# Patient Record
Sex: Male | Born: 1995 | Race: White | Hispanic: No | Marital: Single | Smoking: Current every day smoker
Health system: Southern US, Community
[De-identification: ages and names within clinical notes are randomized; demographics above are authoritative.]

## PROBLEM LIST (undated history)

## (undated) VITALS — BP 107/74 | HR 87 | Temp 97.8°F | Resp 20 | Ht 69.0 in | Wt 149.5 lb

## (undated) DIAGNOSIS — F419 Anxiety disorder, unspecified: Secondary | ICD-10-CM

## (undated) DIAGNOSIS — F319 Bipolar disorder, unspecified: Secondary | ICD-10-CM

## (undated) DIAGNOSIS — K589 Irritable bowel syndrome without diarrhea: Secondary | ICD-10-CM

## (undated) DIAGNOSIS — F909 Attention-deficit hyperactivity disorder, unspecified type: Secondary | ICD-10-CM

## (undated) DIAGNOSIS — K9049 Malabsorption due to intolerance, not elsewhere classified: Secondary | ICD-10-CM

## (undated) DIAGNOSIS — E739 Lactose intolerance, unspecified: Secondary | ICD-10-CM

## (undated) DIAGNOSIS — S34109A Unspecified injury to unspecified level of lumbar spinal cord, initial encounter: Secondary | ICD-10-CM

## (undated) DIAGNOSIS — R51 Headache: Secondary | ICD-10-CM

## (undated) HISTORY — PX: BACK SURGERY: SHX140

## (undated) HISTORY — DX: Irritable bowel syndrome, unspecified: K58.9

---
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
## 2013-07-01 ENCOUNTER — Encounter (HOSPITAL_COMMUNITY): Payer: Self-pay | Admitting: Emergency Medicine

## 2013-07-01 DIAGNOSIS — Z79899 Other long term (current) drug therapy: Secondary | ICD-10-CM | POA: Insufficient documentation

## 2013-07-01 DIAGNOSIS — R112 Nausea with vomiting, unspecified: Secondary | ICD-10-CM | POA: Insufficient documentation

## 2013-07-01 DIAGNOSIS — E7800 Pure hypercholesterolemia, unspecified: Secondary | ICD-10-CM | POA: Insufficient documentation

## 2013-07-01 DIAGNOSIS — Z8659 Personal history of other mental and behavioral disorders: Secondary | ICD-10-CM | POA: Insufficient documentation

## 2013-07-01 DIAGNOSIS — R1032 Left lower quadrant pain: Secondary | ICD-10-CM | POA: Insufficient documentation

## 2013-07-01 DIAGNOSIS — K589 Irritable bowel syndrome without diarrhea: Secondary | ICD-10-CM | POA: Insufficient documentation

## 2013-07-01 DIAGNOSIS — F17200 Nicotine dependence, unspecified, uncomplicated: Secondary | ICD-10-CM | POA: Insufficient documentation

## 2013-07-01 DIAGNOSIS — Z791 Long term (current) use of non-steroidal anti-inflammatories (NSAID): Secondary | ICD-10-CM | POA: Insufficient documentation

## 2013-07-01 LAB — COMPREHENSIVE METABOLIC PANEL
ALT: 26 U/L (ref 0–53)
Albumin: 4.2 g/dL (ref 3.5–5.2)
Alkaline Phosphatase: 81 U/L (ref 39–117)
Potassium: 4.1 mEq/L (ref 3.5–5.1)
Sodium: 136 mEq/L (ref 135–145)
Total Protein: 7.6 g/dL (ref 6.0–8.3)

## 2013-07-01 LAB — CBC WITH DIFFERENTIAL/PLATELET
Basophils Absolute: 0 10*3/uL (ref 0.0–0.1)
Eosinophils Absolute: 1 10*3/uL — ABNORMAL HIGH (ref 0.0–0.7)
Lymphocytes Relative: 24 % (ref 12–46)
MCHC: 35.6 g/dL (ref 30.0–36.0)
Neutrophils Relative %: 58 % (ref 43–77)
RDW: 14.5 % (ref 11.5–15.5)

## 2013-07-01 NOTE — ED Notes (Signed)
Patient presents with multiple complaints. Abdominal pain LLQ, diarrhea, HA, bloodshot eyes.

## 2013-07-01 NOTE — ED Notes (Signed)
No neuro deficits noted.

## 2013-07-02 ENCOUNTER — Emergency Department (HOSPITAL_COMMUNITY)
Admission: EM | Admit: 2013-07-02 | Discharge: 2013-07-02 | Disposition: A | Discharge status: Home / Self Care | Payer: Medicaid Other | Attending: Emergency Medicine | Admitting: Emergency Medicine

## 2013-07-02 ENCOUNTER — Encounter (HOSPITAL_COMMUNITY): Payer: Self-pay | Admitting: *Deleted

## 2013-07-02 ENCOUNTER — Emergency Department (HOSPITAL_COMMUNITY): Payer: Medicaid Other

## 2013-07-02 DIAGNOSIS — K589 Irritable bowel syndrome without diarrhea: Secondary | ICD-10-CM

## 2013-07-02 DIAGNOSIS — R109 Unspecified abdominal pain: Secondary | ICD-10-CM

## 2013-07-02 DIAGNOSIS — R112 Nausea with vomiting, unspecified: Secondary | ICD-10-CM

## 2013-07-02 LAB — URINALYSIS, ROUTINE W REFLEX MICROSCOPIC
Glucose, UA: NEGATIVE mg/dL
Ketones, ur: NEGATIVE mg/dL
Leukocytes, UA: NEGATIVE
pH: 5.5 (ref 5.0–8.0)

## 2013-07-02 MED ORDER — MORPHINE SULFATE 4 MG/ML IJ SOLN
4.0000 mg | Freq: Once | INTRAMUSCULAR | Status: AC
  Administered 2013-07-02: 4 mg via INTRAVENOUS
  Filled 2013-07-02: qty 1

## 2013-07-02 MED ORDER — LOPERAMIDE HCL 2 MG PO CAPS: 2.0000 mg | ORAL_CAPSULE | Freq: Four times a day (QID) | ORAL | Status: DC | PRN

## 2013-07-02 MED ORDER — IOHEXOL 300 MG/ML  SOLN
25.0000 mL | Freq: Once | INTRAMUSCULAR | Status: AC | PRN
  Administered 2013-07-02: 25 mL via ORAL

## 2013-07-02 MED ORDER — ONDANSETRON HCL 4 MG/2ML IJ SOLN
4.0000 mg | Freq: Once | INTRAMUSCULAR | Status: AC
  Administered 2013-07-02: 4 mg via INTRAVENOUS

## 2013-07-02 MED ORDER — IOHEXOL 300 MG/ML  SOLN
100.0000 mL | Freq: Once | INTRAMUSCULAR | Status: AC | PRN
  Administered 2013-07-02: 100 mL via INTRAVENOUS

## 2013-07-02 MED ORDER — ONDANSETRON 8 MG PO TBDP: 8.0000 mg | ORAL_TABLET | Freq: Three times a day (TID) | ORAL | Status: DC | PRN

## 2013-07-02 MED ORDER — ONDANSETRON HCL 4 MG/2ML IJ SOLN
4.0000 mg | Freq: Once | INTRAMUSCULAR | Status: DC
  Filled 2013-07-02: qty 2

## 2013-07-02 NOTE — ED Notes (Signed)
Pt discharged.Vital signs stable and GCS 15.Discharge instruction given. 

## 2013-07-02 NOTE — ED Provider Notes (Signed)
CSN: [id number]     Arrival date & time 07/01/13  2121 History   First MD Initiated Contact with Patient 07/02/13 0221     Chief Complaint  Patient presents with  . Abdominal Pain   (Consider location/radiation/quality/duration/timing/severity/associated sxs/prior Treatment) HPI 17 year old male presents to emergency room with complaint of diffuse abdominal pains, worse in his left lower quadrant, acute on chronic diarrhea, nausea and vomiting, headache, and reddened eyes.  Patient has history of IBS and has chronic diarrhea.  He has not taken any of his Imodium.  He denies any fever or chills.  He does not normally have nausea and vomiting with his IBS.  Sick contacts, no recent illnesses, no recent antibiotics. Past Medical History  Diagnosis Date  . IBS (irritable bowel syndrome)   . Bipolar 1 disorder   . CHO intolerance     high cholesterol, bipolar, anxiety disorder, ADHD  . Anxiety   . ADHD (attention deficit hyperactivity disorder)   . Irritable bowel syndrome   . Headache(784.0)    No past surgical history on file. No family history on file. History  Substance Use Topics  . Smoking status: Current Every Day Smoker -- 1.50 packs/day for 2 years    Types: Cigarettes  . Smokeless tobacco: Current User    Types: Snuff  . Alcohol Use: No    Review of Systems  All other systems reviewed and are negative.    Allergies  Calamari oil and Other  Home Medications   Current Outpatient Rx  Name  Route  Sig  Dispense  Refill  . dicyclomine (BENTYL) 10 MG capsule   Oral   Take 10 mg by mouth 4 (four) times daily -  before meals and at bedtime.         Marland Kitchen ibuprofen (ADVIL,MOTRIN) 600 MG tablet   Oral   Take 1 tablet (600 mg total) by mouth every 6 (six) hours as needed for pain.   30 tablet   0   . meloxicam (MOBIC) 15 MG tablet   Oral   Take 15 mg by mouth 2 (two) times daily.         . Multiple Vitamin (MULTIVITAMIN WITH MINERALS) TABS   Oral   Take 1 tablet  by mouth daily.         Marland Kitchen loperamide (IMODIUM) 2 MG capsule   Oral   Take 1 capsule (2 mg total) by mouth 4 (four) times daily as needed for diarrhea or loose stools.   12 capsule   0   . ondansetron (ZOFRAN ODT) 8 MG disintegrating tablet   Oral   Take 1 tablet (8 mg total) by mouth every 8 (eight) hours as needed for nausea or vomiting.   20 tablet   0    BP 100/58  Pulse 67  Temp(Src) 99.5 F (37.5 C) (Oral)  Resp 16  SpO2 98% Physical Exam  Nursing note and vitals reviewed. Constitutional: He is oriented to person, place, and time. He appears well-developed and well-nourished.  HENT:  Head: Normocephalic and atraumatic.  Nose: Nose normal.  Mouth/Throat: Oropharynx is clear and moist.  Eyes: Conjunctivae and EOM are normal. Pupils are equal, round, and reactive to light.  Neck: Normal range of motion. Neck supple. No JVD present. No tracheal deviation present. No thyromegaly present.  Cardiovascular: Normal rate, regular rhythm, normal heart sounds and intact distal pulses.  Exam reveals no gallop and no friction rub.   No murmur heard. Pulmonary/Chest: Effort normal and breath  sounds normal. No stridor. No respiratory distress. He has no wheezes. He has no rales. He exhibits no tenderness.  Abdominal: Soft. He exhibits no distension and no mass. There is tenderness (diffuse abdominal pain, worse in left lower cord.  Hyperactive bowel sounds). There is no rebound and no guarding.  Musculoskeletal: Normal range of motion. He exhibits no edema and no tenderness.  Lymphadenopathy:    He has no cervical adenopathy.  Neurological: He is alert and oriented to person, place, and time. He exhibits normal muscle tone. Coordination normal.  Skin: Skin is warm and dry. No rash noted. No erythema. No pallor.  Psychiatric: He has a normal mood and affect. His behavior is normal. Judgment and thought content normal.    ED Course  Procedures (including critical care time) Labs  Review Labs Reviewed  CBC WITH DIFFERENTIAL - Abnormal; Notable for the following:    WBC 14.9 (*)    Eosinophils Relative 7 (*)    Neutro Abs 8.7 (*)    Monocytes Absolute 1.6 (*)    Eosinophils Absolute 1.0 (*)    All other components within normal limits  COMPREHENSIVE METABOLIC PANEL  URINALYSIS, ROUTINE W REFLEX MICROSCOPIC   Imaging Review Ct Abdomen Pelvis W Contrast  07/02/2013   CLINICAL DATA:  Left lower quadrant abdominal pain.  History of IBS.  EXAM: CT ABDOMEN AND PELVIS WITH CONTRAST  TECHNIQUE: Multidetector CT imaging of the abdomen and pelvis was performed using the standard protocol following bolus administration of intravenous contrast.  CONTRAST:  OMNIPAQUE IOHEXOL 300 MG/ML  SOLN  COMPARISON:  06/10/2009  FINDINGS: BODY WALL: Unremarkable.  LOWER CHEST: Unremarkable.  ABDOMEN/PELVIS:  Liver: No focal abnormality.  Biliary: No evidence of biliary obstruction or stone.  Pancreas: Unremarkable.  Spleen: Unremarkable.  Adrenals: Unremarkable.  Kidneys and ureters: No hydronephrosis or stone.  Bladder: Unremarkable.  Reproductive: Unremarkable.  Bowel: No obstruction. No appendiceal inflammation.  Retroperitoneum: Enlarged small bowel mesenteric lymph nodes, decreased in size since 2010 however. Current nodal enlargement may represent scarring from previous infection.  Peritoneum: No free fluid or gas.  Vascular: No acute abnormality.  OSSEOUS: L5 pars defects bilaterally. No anterolisthesis or significant degenerative change at this level. 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  IMPRESSION: No evidence of acute intra-abdominal disease.   Electronically Signed   By: Tiburcio Pea M.D.   On: 07/02/2013 05:24    EKG Interpretation   None       MDM   1. Abdominal pain   2. Nausea and vomiting   3. IBS (irritable bowel syndrome)    17 year old male with one week of worsening of his chronic diarrhea, left low quadrant pain starting on Saturday,  with vomiting of dark emesis.  Patient has history of irritable  Bowel syndrome.  He's been taking his Bentyl as prescribed.  He has not taken any of his Imodium.  No fevers no chills.  No sick contacts.  No recent antibiotics use.  Workup here without significant findings aside from slight leukocytosis.  CT scan of abdomen, negative.  Patient feeling better after medication.  Will discharge home to followup with PCP and GI    Olivia Mackie, MD 07/03/13 0730

## 2013-08-13 ENCOUNTER — Emergency Department (HOSPITAL_COMMUNITY)
Admission: EM | Admit: 2013-08-13 | Discharge: 2013-08-14 | Disposition: A | Discharge status: Other Acute Inpatient Hospital | Payer: Medicaid Other | Source: Home / Self Care | Attending: Emergency Medicine | Admitting: Emergency Medicine

## 2013-08-13 ENCOUNTER — Encounter (HOSPITAL_COMMUNITY): Payer: Self-pay | Admitting: Emergency Medicine

## 2013-08-13 ENCOUNTER — Ambulatory Visit (HOSPITAL_COMMUNITY)
Admission: RE | Admit: 2013-08-13 | Discharge: 2013-08-13 | Disposition: A | Discharge status: Home / Self Care | Payer: Federal, State, Local not specified - Other | Attending: Psychiatry | Admitting: Psychiatry

## 2013-08-13 DIAGNOSIS — F319 Bipolar disorder, unspecified: Secondary | ICD-10-CM | POA: Diagnosis present

## 2013-08-13 LAB — URINALYSIS, ROUTINE W REFLEX MICROSCOPIC
Bilirubin Urine: NEGATIVE
GLUCOSE, UA: NEGATIVE mg/dL
HGB URINE DIPSTICK: NEGATIVE
Ketones, ur: NEGATIVE mg/dL
LEUKOCYTES UA: NEGATIVE
Nitrite: NEGATIVE
PH: 6 (ref 5.0–8.0)
PROTEIN: NEGATIVE mg/dL
SPECIFIC GRAVITY, URINE: 1.005 (ref 1.005–1.030)
Urobilinogen, UA: 0.2 mg/dL (ref 0.0–1.0)

## 2013-08-13 LAB — CBC WITH DIFFERENTIAL/PLATELET
BASOS ABS: 0 10*3/uL (ref 0.0–0.1)
Basophils Relative: 0 % (ref 0–1)
EOS ABS: 0.3 10*3/uL (ref 0.0–0.7)
EOS PCT: 2 % (ref 0–5)
HCT: 46.2 % (ref 39.0–52.0)
Hemoglobin: 16 g/dL (ref 13.0–17.0)
Lymphocytes Relative: 23 % (ref 12–46)
Lymphs Abs: 4 10*3/uL (ref 0.7–4.0)
MCH: 29.1 pg (ref 26.0–34.0)
MCHC: 34.6 g/dL (ref 30.0–36.0)
MCV: 84.2 fL (ref 78.0–100.0)
Monocytes Absolute: 1.5 10*3/uL — ABNORMAL HIGH (ref 0.1–1.0)
Monocytes Relative: 9 % (ref 3–12)
NEUTROS PCT: 66 % (ref 43–77)
Neutro Abs: 11.3 10*3/uL — ABNORMAL HIGH (ref 1.7–7.7)
PLATELETS: 377 10*3/uL (ref 150–400)
RBC: 5.49 MIL/uL (ref 4.22–5.81)
RDW: 13.8 % (ref 11.5–15.5)
WBC: 17.2 10*3/uL — AB (ref 4.0–10.5)

## 2013-08-13 LAB — RAPID URINE DRUG SCREEN, HOSP PERFORMED
AMPHETAMINES: NOT DETECTED
BENZODIAZEPINES: NOT DETECTED
Barbiturates: NOT DETECTED
COCAINE: NOT DETECTED
OPIATES: NOT DETECTED
Tetrahydrocannabinol: NOT DETECTED

## 2013-08-13 LAB — COMPREHENSIVE METABOLIC PANEL
ALBUMIN: 4.4 g/dL (ref 3.5–5.2)
ALK PHOS: 86 U/L (ref 39–117)
ALT: 34 U/L (ref 0–53)
AST: 31 U/L (ref 0–37)
BUN: 14 mg/dL (ref 6–23)
CALCIUM: 10 mg/dL (ref 8.4–10.5)
CO2: 30 mEq/L (ref 19–32)
Chloride: 98 mEq/L (ref 96–112)
Creatinine, Ser: 0.96 mg/dL (ref 0.50–1.35)
GFR calc Af Amer: >90 mL/min (ref >90)
GFR calc non Af Amer: >90 mL/min (ref >90)
Glucose, Bld: 96 mg/dL (ref 70–99)
POTASSIUM: 4.6 meq/L (ref 3.7–5.3)
SODIUM: 139 meq/L (ref 137–147)
TOTAL PROTEIN: 8.2 g/dL (ref 6.0–8.3)
Total Bilirubin: 0.3 mg/dL (ref 0.3–1.2)

## 2013-08-13 LAB — ACETAMINOPHEN LEVEL: Acetaminophen (Tylenol), Serum: <15 ug/mL (ref 10–30)

## 2013-08-13 LAB — ETHANOL: Alcohol, Ethyl (B): <11 mg/dL (ref 0–11)

## 2013-08-13 MED ORDER — BACID PO TABS
2.0000 | ORAL_TABLET | Freq: Three times a day (TID) | ORAL | Status: DC
  Administered 2013-08-14 (×3): 2 via ORAL
  Filled 2013-08-13 (×4): qty 2

## 2013-08-13 MED ORDER — LORAZEPAM 1 MG PO TABS
1.0000 mg | ORAL_TABLET | Freq: Three times a day (TID) | ORAL | Status: DC | PRN
Start: 2013-08-13 — End: 2013-08-14

## 2013-08-13 MED ORDER — ADULT MULTIVITAMIN W/MINERALS CH
1.0000 | ORAL_TABLET | Freq: Every day | ORAL | Status: DC
  Administered 2013-08-14: 1 via ORAL
  Filled 2013-08-13: qty 1

## 2013-08-13 MED ORDER — DICYCLOMINE HCL 10 MG PO CAPS: 10.0000 mg | ORAL_CAPSULE | Freq: Three times a day (TID) | ORAL | Status: DC

## 2013-08-13 MED ORDER — IBUPROFEN 200 MG PO TABS
600.0000 mg | ORAL_TABLET | Freq: Four times a day (QID) | ORAL | Status: DC | PRN
  Administered 2013-08-14: 600 mg via ORAL
  Filled 2013-08-13: qty 3

## 2013-08-13 MED ORDER — MELOXICAM 15 MG PO TABS: 15.0000 mg | ORAL_TABLET | Freq: Two times a day (BID) | ORAL | Status: DC

## 2013-08-13 MED ORDER — ONDANSETRON HCL 4 MG PO TABS: 4.0000 mg | ORAL_TABLET | Freq: Three times a day (TID) | ORAL | Status: DC | PRN

## 2013-08-13 MED ORDER — LOPERAMIDE HCL 2 MG PO CAPS: 2.0000 mg | ORAL_CAPSULE | Freq: Four times a day (QID) | ORAL | Status: DC | PRN

## 2013-08-13 MED ORDER — ONDANSETRON 4 MG PO TBDP: 8.0000 mg | ORAL_TABLET | Freq: Three times a day (TID) | ORAL | Status: DC | PRN

## 2013-08-13 MED ORDER — DICLOFENAC SODIUM 75 MG PO TBEC
75.0000 mg | DELAYED_RELEASE_TABLET | Freq: Two times a day (BID) | ORAL | Status: DC
  Administered 2013-08-14 (×2): 75 mg via ORAL
  Filled 2013-08-13 (×3): qty 1

## 2013-08-13 MED ORDER — NICOTINE 21 MG/24HR TD PT24
21.0000 mg | MEDICATED_PATCH | Freq: Every day | TRANSDERMAL | Status: DC
  Administered 2013-08-14: 21 mg via TRANSDERMAL
  Filled 2013-08-13: qty 1

## 2013-08-13 MED ORDER — HYOSCYAMINE SULFATE 0.125 MG PO TABS
0.3750 mg | ORAL_TABLET | Freq: Two times a day (BID) | ORAL | Status: DC
  Administered 2013-08-14 (×3): 0.375 mg via ORAL
  Filled 2013-08-13 (×3): qty 3

## 2013-08-13 MED ORDER — ALUM & MAG HYDROXIDE-SIMETH 200-200-20 MG/5ML PO SUSP: 30.0000 mL | ORAL | Status: DC | PRN
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
## 2013-08-13 NOTE — ED Notes (Signed)
Patient is alert and oriented x3.  He is wanting to be seen for SI.  Patient states that he  Has been having thoughts over the last month and has a plan to cut major arteries with a  Knife.  Patient states that he only has chronic pain in his lower back.

## 2013-08-14 ENCOUNTER — Encounter (HOSPITAL_COMMUNITY): Payer: Self-pay | Admitting: *Deleted

## 2013-08-14 ENCOUNTER — Inpatient Hospital Stay (HOSPITAL_COMMUNITY): Admit: 2013-08-14 | Payer: Self-pay

## 2013-08-14 ENCOUNTER — Inpatient Hospital Stay (HOSPITAL_COMMUNITY)
Admission: AD | Admit: 2013-08-14 | Discharge: 2013-08-17 | DRG: 885 | Disposition: A | Discharge status: Home / Self Care | Payer: Medicaid Other | Source: Intra-hospital | Attending: Psychiatry | Admitting: Psychiatry

## 2013-08-14 ENCOUNTER — Encounter (HOSPITAL_COMMUNITY): Payer: Self-pay | Admitting: Psychiatry

## 2013-08-14 DIAGNOSIS — K589 Irritable bowel syndrome without diarrhea: Secondary | ICD-10-CM | POA: Diagnosis present

## 2013-08-14 DIAGNOSIS — F331 Major depressive disorder, recurrent, moderate: Secondary | ICD-10-CM

## 2013-08-14 DIAGNOSIS — F319 Bipolar disorder, unspecified: Secondary | ICD-10-CM | POA: Diagnosis present

## 2013-08-14 DIAGNOSIS — F411 Generalized anxiety disorder: Secondary | ICD-10-CM | POA: Diagnosis present

## 2013-08-14 DIAGNOSIS — Z79899 Other long term (current) drug therapy: Secondary | ICD-10-CM

## 2013-08-14 DIAGNOSIS — F3130 Bipolar disorder, current episode depressed, mild or moderate severity, unspecified: Principal | ICD-10-CM | POA: Diagnosis present

## 2013-08-14 DIAGNOSIS — F902 Attention-deficit hyperactivity disorder, combined type: Secondary | ICD-10-CM

## 2013-08-14 DIAGNOSIS — G4700 Insomnia, unspecified: Secondary | ICD-10-CM | POA: Diagnosis present

## 2013-08-14 DIAGNOSIS — F909 Attention-deficit hyperactivity disorder, unspecified type: Secondary | ICD-10-CM | POA: Diagnosis present

## 2013-08-14 DIAGNOSIS — R45851 Suicidal ideations: Secondary | ICD-10-CM

## 2013-08-14 NOTE — BH Assessment (Signed)
Per Luwanda Daniels, AC at [facility], adult unit is at capacity. Contacted the following facilities for placement:   [facility]: At capacity [facility]: At capacity [facility]: At capacity [facility]: At capacity [facility]: At capacity [facility]: At capacity [facility]: At capacity [facility]: At capacity [facility]: At capacity [facility]: At capacity  [facility]: Left voicemail  [facility]: Beds available. Faxed clinical information for review.   Lajoyce Tamura Ellis Quintessa Simmerman Jr, LPC, [facility] Triage Specialist  

## 2013-08-14 NOTE — Tx Team (Signed)
Initial Interdisciplinary Treatment Plan  PATIENT STRENGTHS: (choose at least two) Ability for insight Active sense of humor Average or above average intelligence Capable of independent living Communication skills  PATIENT STRESSORS: Health problems   PROBLEM LIST: Problem List/Patient Goals Date to be addressed Date deferred Reason deferred Estimated date of resolution  "Want decrease depression and learn ways to control anger in a healthier manner" 14 Aug 2013                                                      DISCHARGE CRITERIA:  Ability to meet basic life and health needs Adequate post-discharge living arrangements Improved stabilization in mood, thinking, and/or behavior Safe-care adequate arrangements made Verbal commitment to aftercare and medication compliance  PRELIMINARY DISCHARGE PLAN: Attend aftercare/continuing care group Outpatient therapy Return to previous living arrangement  PATIENT/FAMIILY INVOLVEMENT: This treatment plan has been presented to and reviewed with the patient, Meredeth IdeMark A Bedrosian, and/or family member.  The patient and family have been given the opportunity to ask questions and make suggestions.  Izola PriceDax, Devone Tousley Mae 08/14/2013, 11:49 PM

## 2013-08-14 NOTE — ED Notes (Signed)
[facility]

## 2013-08-14 NOTE — ED Provider Notes (Signed)
Medical screening examination/treatment/procedure(s) were performed by non-physician practitioner and as supervising physician I was immediately available for consultation/collaboration.  EKG Interpretation   None        Courtney F Horton, MD 08/14/13 1701 

## 2013-08-14 NOTE — [facility] Counselor (Signed)
[facility] Completed support paperwork signed and [facility] and originals placed in pt's chart.   Evette Cristalaroline Paige Leasa Kincannon, [place] Assessment Counselor
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
## 2013-08-14 NOTE — ED Notes (Signed)
MOM IS  TAKING PT BELONGINGS HOME

## 2013-08-15 ENCOUNTER — Encounter (HOSPITAL_COMMUNITY): Payer: Self-pay | Admitting: Family

## 2013-08-15 DIAGNOSIS — F3130 Bipolar disorder, current episode depressed, mild or moderate severity, unspecified: Principal | ICD-10-CM

## 2013-08-15 DIAGNOSIS — R45851 Suicidal ideations: Secondary | ICD-10-CM

## 2013-08-15 MED ORDER — ALUM & MAG HYDROXIDE-SIMETH 200-200-20 MG/5ML PO SUSP: 30.0000 mL | ORAL | Status: DC | PRN

## 2013-08-15 MED ORDER — DIPHENHYDRAMINE HCL 25 MG PO CAPS: 50.0000 mg | ORAL_CAPSULE | Freq: Four times a day (QID) | ORAL | Status: DC | PRN

## 2013-08-15 MED ORDER — LISDEXAMFETAMINE DIMESYLATE 30 MG PO CAPS
30.0000 mg | ORAL_CAPSULE | Freq: Every day | ORAL | Status: DC
  Administered 2013-08-15 – 2013-08-17 (×3): 30 mg via ORAL
  Filled 2013-08-15 (×3): qty 1

## 2013-08-15 MED ORDER — ACETAMINOPHEN 325 MG PO TABS
650.0000 mg | ORAL_TABLET | Freq: Four times a day (QID) | ORAL | Status: DC | PRN
  Administered 2013-08-15 (×2): 650 mg via ORAL
  Filled 2013-08-15 (×2): qty 2

## 2013-08-15 MED ORDER — ONDANSETRON HCL 4 MG PO TABS: 4.0000 mg | ORAL_TABLET | Freq: Three times a day (TID) | ORAL | Status: DC | PRN

## 2013-08-15 MED ORDER — ADULT MULTIVITAMIN W/MINERALS CH
1.0000 | ORAL_TABLET | Freq: Every day | ORAL | Status: DC
  Administered 2013-08-15 – 2013-08-17 (×3): 1 via ORAL
  Filled 2013-08-15 (×6): qty 1

## 2013-08-15 MED ORDER — LORAZEPAM 1 MG PO TABS: 1.0000 mg | ORAL_TABLET | Freq: Three times a day (TID) | ORAL | Status: DC | PRN

## 2013-08-15 MED ORDER — LAMOTRIGINE 25 MG PO TABS
25.0000 mg | ORAL_TABLET | Freq: Two times a day (BID) | ORAL | Status: DC
  Administered 2013-08-15 – 2013-08-16 (×3): 25 mg via ORAL
  Filled 2013-08-15 (×6): qty 1

## 2013-08-15 MED ORDER — NICOTINE 21 MG/24HR TD PT24
21.0000 mg | MEDICATED_PATCH | Freq: Every day | TRANSDERMAL | Status: DC
  Administered 2013-08-15 – 2013-08-17 (×3): 21 mg via TRANSDERMAL
  Filled 2013-08-15 (×6): qty 1

## 2013-08-15 MED ORDER — DICLOFENAC SODIUM 75 MG PO TBEC
75.0000 mg | DELAYED_RELEASE_TABLET | Freq: Two times a day (BID) | ORAL | Status: DC
  Administered 2013-08-15 – 2013-08-17 (×5): 75 mg via ORAL
  Filled 2013-08-15 (×10): qty 1

## 2013-08-15 MED ORDER — HYOSCYAMINE SULFATE ER 0.375 MG PO TB12
0.3750 mg | ORAL_TABLET | Freq: Two times a day (BID) | ORAL | Status: DC
  Administered 2013-08-15 – 2013-08-17 (×5): 0.375 mg via ORAL
  Filled 2013-08-15 (×9): qty 1

## 2013-08-15 MED ORDER — MAGNESIUM HYDROXIDE 400 MG/5ML PO SUSP: 30.0000 mL | Freq: Every day | ORAL | Status: DC | PRN

## 2013-08-15 NOTE — [facility] Suicide Risk Assessment (Signed)
Suicide Risk Assessment  Admission Assessment     Nursing information obtained from:  Patient Demographic factors:  Male;Adolescent or young adult;Caucasian;Low socioeconomic status;Unemployed Current Mental Status:  Suicidal ideation indicated by patient Loss Factors:  NA Historical Factors:  Prior suicide attempts;Family history of mental illness or substance abuse Risk Reduction Factors:  Sense of responsibility to family;Living with another person, especially a relative;Positive coping skills or problem solving skills  CLINICAL FACTORS:   Severe Anxiety and/or Agitation Bipolar Disorder:   Depressive phase Depression:   Anhedonia Hopelessness Impulsivity Insomnia Recent sense of peace/wellbeing Severe Chronic Pain Previous Psychiatric Diagnoses and Treatments Medical Diagnoses and Treatments/Surgeries  COGNITIVE FEATURES THAT CONTRIBUTE TO RISK:  Closed-mindedness Loss of executive function Polarized thinking    SUICIDE RISK:   Moderate:  Frequent suicidal ideation with limited intensity, and duration, some specificity in terms of plans, no associated intent, good self-control, limited dysphoria/symptomatology, some risk factors present, and identifiable protective factors, including available and accessible social support.  PLAN OF CARE: Admit for bipolar depression with suicidal ideation with plan of cutting himself. He needs crisis stabilization, safety monitoring and medication management.   I certify that inpatient services furnished can reasonably be expected to improve the patient's condition.  Aidric Endicott,JANARDHAHA R. 08/15/2013, 10:15 AM

## 2013-08-15 NOTE — Progress Notes (Signed)
Patient ID: [name], male   DOB: Apr 27, 1996, 18 y.o.   MRN: [id number] D- Patient reports his sleep was fair and his appetite is good.  His energy level is high and his ability to pay attention is poor.  He rates his depression at 5/10 he denies thoughts of suicide.  A- Supported patient.  R- patient has been attending groups and interacting with peers and staff.

## 2013-08-15 NOTE — [facility] Group Notes (Signed)
[facility] LCSW Group Therapy  08/15/2013  1:15 PM   Type of Therapy:  Group Therapy  Participation Level:  Active  Participation Quality:  Attentive, Sharing and Supportive  Affect:  Depressed and Flat  Cognitive:  Alert and Oriented  Insight:  Developing/Improving and Engaged  Engagement in Therapy:  Developing/Improving and Engaged  Modes of Intervention:  Clarification, Confrontation, Discussion, Education, Exploration, Limit-setting, Orientation, Problem-solving, Rapport Building, Dance movement psychotherapisteality Testing, Socialization and Support  Summary of Progress/Problems: The topic for group therapy was feelings about diagnosis.  Pt actively participated in group discussion on their past and current diagnosis and how they feel towards this.  Pt also identified how society and family members judge them, based on their diagnosis as well as stereotypes and stigmas.   Pt shared that he has been diagnosed with bipolar disorder and ADHD.  Pt states that he accepted these diagnoses as he has had to deal with them for years.  Pt was able to relate to peers about being judged.  Pt actively participated and was engaged in group discussion.    Reyes IvanChelsea Horton, LCSW 08/15/2013 2:14 PM

## 2013-08-15 NOTE — [facility] Group Notes (Signed)
Adult Psychoeducational Group Note  Date:  08/15/2013 Time:  9:52 PM  Group Topic/Focus:  Wrap-Up Group:   The focus of this group is to help patients review their daily goal of treatment and discuss progress on daily workbooks.  Participation Level:  Active  Participation Quality:  Appropriate and Attentive  Affect:  Appropriate  Cognitive:  Appropriate  Insight: Appropriate  Engagement in Group:  Engaged  Modes of Intervention:  Discussion  Additional Comments:  Patient stated that he as achieved his goal in trying to go back to school. He stated that he spoke with a Child psychotherapistsocial worker who give him the right materials needed to go back to school.  [name], [name] 08/15/2013, 9:52 PM

## 2013-08-15 NOTE — Progress Notes (Signed)
Recreation Therapy Notes  Animal-Assisted Activity/Therapy (AAA/T) Program Checklist/Progress Notes Patient Eligibility Criteria Checklist & Daily Group note for Rec Tx Intervention  Date: 01.06.2015 Time: 2:45pm [address]    AAA/T Program Assumption of Risk Form signed by Patient/ or Parent Legal Guardian yes  Patient is free of allergies or sever asthma yes  Patient reports no fear of animals yes  Patient reports no history of cruelty to animals yes   Patient understands his/her participation is voluntary yes  Patient washes hands before animal contact yes  Patient washes hands after animal contact yes  Behavioral Response: Appropriate, Attentive  Education: Hand Washing, Appropriate Animal Interaction   Education Outcome: Acknowledges understanding   Clinical Observations/Feedback: Patient interacted appropriately with therapy dog team and peers. Patient shared stories about his animals and experiences with animals.    Marykay Lexenise L Zarin Knupp, LRT/CTRS  Jearl KlinefelterBlanchfield, Lena Fieldhouse L 08/15/2013 4:17 PM

## 2013-08-15 NOTE — [facility] Counselor (Signed)
Adult Psychosocial Assessment Update Interdisciplinary Team  Previous Behavior Health Hospital admissions/discharges:  Admissions Discharges  Date: 11/07/12 Date: 11/10/12  Date: Date:  Date: Date:  Date: Date:  Date: Date:   Changes since the last Psychosocial Assessment (including adherence to outpatient mental health and/or substance abuse treatment, situational issues contributing to decompensation and/or relapse). Pt reports coming to the hospital for bipolar disorder, being off his meds and SI.  Pt states that he had a plan to cut his veins.  Pt states that he's been off his meds since April and states [facility] not [facility] due to his mom seeing the same doctor there.  Pt states that he has a family member that goes to [facility] for medication management and would be interested in following up there upon d/c.                 Discharge Plan 1. Will you be returning to the same living situation after discharge?   Yes: X [place] lives [place] in [place] and can return home there.   No:      If no, what is your plan?           2. Would you like a referral for services when you are discharged? Yes: X    If yes, for what services? Pt wants to go to [facility] for medication management.    No:              Summary and Recommendations (to be completed by the evaluator) Patient is a 18 year old Caucasian Male with a diagnosis of Bipolar I disorder, Current or most recent episode depressed, Severe.  Patient lives in [place] his family.  Patient will benefit from crisis stabilization, medication evaluation, group therapy and psycho education in addition to case management for discharge planning.                         Signature:  [name], [name], 08/15/2013 9:26 AM

## 2013-08-15 NOTE — Progress Notes (Signed)
Patient ID: [name], male   DOB: January 24, 1996, 18 y.o.   MRN: [id number] D:  18 year old Caucasian male admitted with suicidal ideation.  Patient states he was having thoughts of wanting to kill himself.  States he located where all the major arteries are in his body and was going to cut himself in an artery with a knife.  States he called his best friend and confessed to him.  Friend was able to contact mother and get family involved in helping the patient, who finally agreed to come for help.  States he has been here in the past and was placed on medication that he thought was working well.  [facility] and they wanted to change all of his medications so he left there and never went back.  He has been off of his medicines for several months.  States he has an anger problem and does not use coping mechanisms effectively.  He usually ends up punching walls or trees.  He also states that sometimes when he is angry he likes to go off by himself and go camping in the woods.  States music is helpful to him as well.   A:  Admission process completed.  Patient offered food and fluids.  He was oriented to his room, the unit, and the unit schedule.  Skin assessment completed, no issues noted.  R:  Cooperative with the admission process.  Patient is very fidgety during the interview.  States he has ADHD and is not on any medication for it at this time.

## 2013-08-15 NOTE — Progress Notes (Signed)
The focus of this group is to educate the patient on the purpose and policies of crisis stabilization and provide a format to answer questions about their admission.  The group details unit policies and expectations of patients while admitted. Patient attended group and was an active participant.  He participated in exercises and in taking about orientation to unit.  Patient feels comfortable because he hs ben here before.  He also talked about recovery and what his goals are.  He wants to go back to school
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
## 2013-08-15 NOTE — Progress Notes (Signed)
Recreation Therapy Notes  INPATIENT RECREATION THERAPY ASSESSMENT  Patient Stressors: School   Coping Skills: Isolate, Avoidance, Exercise, Art/Dance, Music, Sports, Other: Fishing/Camping  Leisure Interests:  Exercise, Gardening, Listening to Music, Playing a Building control surveyorMusical Instrument,  Sports, Travel, [employer] Games, Writing, Other: Fishing/Camping  Personal Challenges: Anger, Communication, Expressing Yourself,   Patient indicated he does not have a physical disability that would prevent participating in recreation therapy group sessions.  Patient listed the following strengths: "Can take apart and fix most things/Fishing"  Patient indicated he would like to change the following about himself: My Anger  Patient listed the following current recreation interests: Exercise/Fish/Camping.         Patient goal for hospitalization: Find a better coping skill than anger.   Patient [place]   Marykay Lexenise L Detrich Rakestraw, LRT/CTRS  Joniqua Sidle L 08/15/2013 4:19 PM

## 2013-08-16 DIAGNOSIS — R45850 Homicidal ideations: Secondary | ICD-10-CM

## 2013-08-16 DIAGNOSIS — F909 Attention-deficit hyperactivity disorder, unspecified type: Secondary | ICD-10-CM

## 2013-08-16 MED ORDER — LAMOTRIGINE 100 MG PO TABS
50.0000 mg | ORAL_TABLET | Freq: Two times a day (BID) | ORAL | Status: DC
  Administered 2013-08-16 – 2013-08-17 (×2): 50 mg via ORAL
  Filled 2013-08-16: qty 14
  Filled 2013-08-16 (×2): qty 2
  Filled 2013-08-16: qty 14
  Filled 2013-08-16 (×2): qty 2

## 2013-08-16 NOTE — Progress Notes (Signed)
[facility] Progress [facility]  08/16/2013 11:49 AM Dennis IdeMark A [name]  MRN:  [id number] Subjective:  Patient was seen and chart reviewed. Patient has been suffering with depression, suicidal ideation and also bipolar disorder. Patient has been diagnosed with attention deficit hyperactivity disorder while in school. Patient has been noncompliant with his medication for a while. Patient has requested medication management for the problems. Patient has been compliant with his medication management and inpatient psychiatric program without difficulties. Patient has no irritability, agitation or aggressive behaviors and currently has passive suicidal ideations and denies homicidal ideation and contracts for safety while in the hospital. Diagnosis:   DSM5: Schizophrenia Disorders:   Obsessive-Compulsive Disorders:   Trauma-Stressor Disorders:   Substance/Addictive Disorders:   Depressive Disorders:  Disruptive Mood Dysregulation Disorder (296.99)  Axis I: ADHD, combined type and Bipolar, Depressed  ADL's:  Impaired  Sleep: Poor  Appetite:  Fair  Suicidal Ideation:  Number contracts for safety while in the hospital  Homicidal Ideation:  Patient was upset with his family and stated he has a homicidal ideation but no intention or plan  AEB (as evidenced by):  Psychiatric Specialty Exam: ROS  Blood pressure 138/78, pulse 84, temperature 97.4 F (36.3 C), temperature source Oral, resp. rate 20, height 5' 10.25" (1.784 m), weight 107.049 kg (236 lb).Body mass index is 33.64 kg/(m^2).  General Appearance: Bizarre, Disheveled and Guarded  Eye Contact::  Fair  Speech:  Clear and Coherent  Volume:  Normal  Mood:  Anxious, Depressed, Hopeless and Worthless  Affect:  Constricted and Depressed  Thought Process:  Goal Directed and Intact  Orientation:  Full (Time, Place, and Person)  Thought Content:  Rumination  Suicidal Thoughts:  Yes.  with intent/plan  Homicidal Thoughts:  Yes.  without intent/plan   Memory:  Immediate;   Fair  Judgement:  Fair  Insight:  Fair  Psychomotor Activity:  Psychomotor Retardation  Concentration:  Fair  Recall:  Fair  Akathisia:  NA  Handed:  Right  AIMS (if indicated):     Assets:  Communication Skills Desire for Improvement Financial Resources/Insurance Housing Physical Health Resilience Social Support  Sleep:  Number of Hours: 6.75   Current Medications: Current Facility-Administered Medications  Medication Dose Route Frequency Provider Last Rate Last Dose  . acetaminophen (TYLENOL) tablet 650 mg  650 mg Oral Q6H PRN Dennis FriesMeghan Blankmann, NP   650 mg at 08/15/13 1547  . alum & mag hydroxide-simeth (MAALOX/MYLANTA) 200-200-20 MG/5ML suspension 30 mL  30 mL Oral Q4H PRN Dennis Blankmann, NP      . diclofenac (VOLTAREN) EC tablet 75 mg  75 mg Oral BID Dennis SettleJanardhaha R Daysy Santini, MD   75 mg at 08/16/13 0759  . diphenhydrAMINE (BENADRYL) capsule 50 mg  50 mg Oral Q6H PRN Dennis Blankmann, NP      . hyoscyamine (LEVBID) 0.375 MG 12 hr tablet 0.375 mg  0.375 mg Oral Q12H Dennis SettleJanardhaha R Alton Tremblay, MD   0.375 mg at 08/16/13 0759  . lamoTRIgine (LAMICTAL) tablet 25 mg  25 mg Oral BID Dennis SettleJanardhaha R Dennis Gadberry, MD   25 mg at 08/16/13 0759  . lisdexamfetamine (VYVANSE) capsule 30 mg  30 mg Oral Daily Dennis SettleJanardhaha R Shakiera Edelson, MD   30 mg at 08/16/13 0759  . LORazepam (ATIVAN) tablet 1 mg  1 mg Oral Q8H PRN Dennis Blankmann, NP      . magnesium hydroxide (MILK OF MAGNESIA) suspension 30 mL  30 mL Oral Daily PRN Dennis FriesMeghan Blankmann, NP      . multivitamin with minerals  tablet 1 tablet  1 tablet Oral Daily Dennis Fries, NP   1 tablet at 08/16/13 0800  . nicotine (NICODERM CQ - dosed in mg/24 hours) patch 21 mg  21 mg Transdermal Daily Dennis Blankmann, NP   21 mg at 08/16/13 0800  . ondansetron (ZOFRAN) tablet 4 mg  4 mg Oral Q8H PRN Dennis Fries, NP        Lab Results: No results found for this or any previous visit (from the past 48 hour(s)).  Physical  Findings: AIMS: Facial and Oral Movements Muscles of Facial Expression: None, normal Lips and Perioral Area: None, normal Jaw: None, normal Tongue: None, normal,Extremity Movements Upper (arms, wrists, hands, fingers): None, normal Lower (legs, knees, ankles, toes): None, normal, Trunk Movements Neck, shoulders, hips: None, normal, Overall Severity Severity of abnormal movements (highest score from questions above): None, normal Incapacitation due to abnormal movements: None, normal Patient's awareness of abnormal movements (rate only patient's report): No Awareness, Dental Status Current problems with teeth and/or dentures?: No Does patient usually wear dentures?: No  CIWA:    COWS:     Treatment Plan Summary: Daily contact with patient to assess and evaluate symptoms and progress in treatment Medication management  Plan: Treatment Plan/Recommendations:   1. Admit for crisis management and stabilization. 2. Medication management to reduce current symptoms to base line and improve the patient's overall level of functioning. Continue Vyvanse 30 mg daily for ADHD and increase lamotrigine 50 mg twice daily for mood swings 3. Treat health problems as indicated. 4. Develop treatment plan to decrease risk of relapse upon discharge and to reduce the need for readmission. 5. Psycho-social education regarding relapse prevention and self care. 6. Health care follow up as needed for medical problems. 7. Restart home medications where appropriate. 8. Disposition plans are in progress, patient may be discharged tomorrow when he can continue to show clinical improvement and contracts for safety   Medical Decision Making Problem Points:  Established problem, worsening (2), New problem, with no additional work-up planned (3), Review of last therapy session (1) and Review of psycho-social stressors (1) Data Points:  Review or order clinical lab tests (1) Review or order medicine tests (1) Review  of medication regiment & side effects (2) Review of new medications or change in dosage (2)  I certify that inpatient services furnished can reasonably be expected to improve the patient's condition.   Josphine Laffey,Dennis R. 08/16/2013, 11:49 AM

## 2013-08-16 NOTE — Tx Team (Signed)
Interdisciplinary Treatment Plan Update (Adult)  Date: 08/16/2013  Time Reviewed:  9:45 AM  Progress in Treatment: Attending groups: Yes Participating in groups:  Yes Taking medication as prescribed:  Yes Tolerating medication:  Yes Family/Significant othe contact made: CSW will make attempts today Patient understands diagnosis:  Yes Discussing patient identified problems/goals with staff:  Yes Medical problems stabilized or resolved:  Yes Denies suicidal/homicidal ideation: Yes Issues/concerns per patient self-inventory:  Yes Other:  New problem(s) identified: N/A  Discharge Plan or Barriers: Pt will follow up at [facility] for medication management.    Reason for Continuation of Hospitalization: Anxiety Depression Medication Stabilization  Comments: N/A  Estimated length of stay: 1 day, d/c tomorrow  For review of initial/current patient goals, please see plan of care.  Attendees: Patient:     Family:     Physician:  Dr. Javier GlazierJohnalagadda 08/16/2013 10:16 AM   Nursing:   Leighton ParodyBritney Tyson, RN 08/16/2013 10:16 AM   Clinical Social Worker:  Reyes Ivanhelsea Horton, LCSW 08/16/2013 10:16 AM   Other: Claudette Headonrad Withrow, PA 08/16/2013 10:16 AM   Other:  Sherrye PayorValerie Noch, care coordination 08/16/2013 10:16 AM   Other:  Juline PatchQuylle Hodnett, LCSW 08/16/2013 10:16 AM   Other:  Harold Barbanonecia Byrd, RN /08/16/2013 10:17 AM   Other:  Marzetta Boardhrista Dopson, RN 08/16/2013 10:17 AM   Other:    Other:    Other:    Other:    Other:     Scribe for Treatment Team:   Carmina MillerHorton, Dorlisa Savino Nicole, 08/16/2013 10:16 AM

## 2013-08-16 NOTE — Progress Notes (Signed)
D:Patient in the dayroom on approach.  Patient states he feels anxious about being discharged tomorrow.    Patient states he feels that he has gotten help during his admission.  Patient speaks rapidly about his job that he plans to go back to.  Patient denies SI/HI and denies AVH. A: Staff to monitor Q 15 mins for safety.  Encouragement and support offered.  Scheduled medications administered per orders. R: Patient remains safe on the unit.  Patient attended group tonight. Patient visible on the unit and interacting with peers.  Patient taking administered medications.

## 2013-08-16 NOTE — [facility] Group Notes (Signed)
[facility] LCSW Group Therapy  Emotional Regulation 1:15 - 2: 30 PM        08/16/2013  3:33 PM   Type of Therapy:  Group Therapy  Participation Level:  Appropriate  Participation Quality:  Appropriate  Affect:  Appropriate  Cognitive:  Attentive Appropriate  Insight:  Developing/Improving Engaged  Engagement in Therapy:  Developing/Improving Engaged  Modes of Intervention:  Discussion Exploration Problem-Solving Supportive  Summary of Progress/Problems:  Group topic was emotional regulations.  Patient participated in the discussion and was able to identify an emotion that needed to regulated. Patient shared he deals with anger.  He stated he is learning to walk away or hit a wall rather than a person.  Patient was able to identify approprite coping skills.  Wynn BankerHodnett, Dorenda Pfannenstiel Hairston 08/16/2013 3:33 PM

## 2013-08-16 NOTE — [facility] Suicide Risk Assessment (Signed)
[facility] INPATIENT:  Family/Significant Other Suicide Prevention Education  Suicide Prevention Education:  Education Completed; Amy Aurea GraffStraughan - mother [phone]),  (name of family member/significant other) has been identified by the patient as the family member/significant other with whom the patient will be residing, and identified as the person(s) who will aid the patient in the event of a mental health crisis (suicidal ideations/suicide attempt).  With written consent from the patient, the family member/significant other has been provided the following suicide prevention education, prior to the and/or following the discharge of the patient.  The suicide prevention education provided includes the following:  Suicide risk factors  Suicide prevention and interventions  National Suicide Hotline telephone number  [facility] assessment telephone number  [facility] Emergency Assistance 911  [place] and/or Residential Mobile Crisis Unit telephone number  Request made of family/significant other to:  Remove weapons (e.g., guns, rifles, knives), all items previously/currently identified as safety concern.    Remove drugs/medications (over-the-counter, prescriptions, illicit drugs), all items previously/currently identified as a safety concern.  The family member/significant other verbalizes understanding of the suicide prevention education information provided.  The family member/significant other agrees to remove the items of safety concern listed above.  Carmina MillerHorton, Jacquelene Kopecky Nicole 08/16/2013, 10:45 AM

## 2013-08-16 NOTE — Progress Notes (Signed)
[facility] Group Notes:  (Nursing/MHT/Case Management/Adjunct)  Date:  08/16/2013  Time:  9:43 PM  Type of Therapy:  Group Therapy  Participation Level:  Active  Participation Quality:  Appropriate  Affect:  Appropriate  Cognitive:  Appropriate  Insight:  Appropriate  Engagement in Group:  Engaged  Modes of Intervention:  Discussion  Summary of Progress/Problems:The Pt expressed that today was a very good day.Today was a very good day because his needs were meet.  Octavio Mannshigpen, Jaquelyne Firkus Lee 08/16/2013, 9:43 PM

## 2013-08-16 NOTE — Progress Notes (Signed)
Recreation Therapy Notes  Date: 01.07.2014 Time: 2:45pm Location: [address] Dayroom   Group Topic: Communication, Team Building, Problem Solving  Goal Area(s) Addresses:  Patient will effectively work with peer towards shared goal.  Patient will identify skill used to make activity successful.  Patient will identify how skills used during activity can be used to reach post d/c goals.   Behavioral Response: Appropriate   Intervention: Problem Solving Activitiy  Activity: Life Boat. Patients were given a scenario about being on a sinking yacht. Patients were informed the yacht included 15 guest, 8 of which could be placed on the life boat, along with all group members. Individuals on guest list were of varying socioeconomic classes such as a Education officer, museumriest, Materials engineerresident Obama, MidwifeBus Driver, Tree surgeonTeacher and Chef.   Education: Pharmacist, communityocial Skills, Discharge Planning   Education Outcome: Acknowledges Education  Clinical Observations/Feedback: Patient actively engaged in group session, voicing his opinion and debating with peers appropriately. Patient identified qualities that guided his decision making, in addition to importance of skills used during group session. Patient did not verbalize impact skill would have on life post d/c, but nodded in agreement with points raised by other group members.     Marykay Lexenise L Arnetra Terris, LRT/CTRS  Ortencia Askari L 08/16/2013 5:11 PM

## 2013-08-16 NOTE — [facility] Group Notes (Signed)
[facility]   08/16/2013 8:45 AM  Participation Quality:  Alert, Appropriate and Oriented  Mood/Affect:  Calm  Depression Rating: 0    Anxiety Rating:  0  Thoughts of Suicide:  Pt denies SI/HI  Will you contract for safety?   Yes  Current AVH:  Pt denies  Plan for Discharge/Comments:  Pt attended discharge planning group and actively participated in group.  CSW provided pt with today's workbook.  Pt reports feeling better today and thinks the meds are helping.  [place] and has follow up scheduled at [facility] for medication management.  No further needs voiced by pt at this time.    Transportation Means: Pt reports access to transportation - family will pick pt up  Supports: No supports mentioned at this time  Reyes IvanChelsea Horton, LCSW 08/16/2013 9:56 AM

## 2013-08-16 NOTE — Progress Notes (Signed)
Adult Psychoeducational Group Note  Date:  08/16/2013 Time:  11:00am Group Topic/Focus:  Personal Choices and Values:   The focus of this group is to help patients assess and explore the importance of values in their lives, how their values affect their decisions, how they express their values and what opposes their expression.  Participation Level:  Active  Participation Quality:  Appropriate and Attentive  Affect:  Appropriate  Cognitive:  Alert and Appropriate  Insight: Appropriate  Engagement in Group:  Engaged  Modes of Intervention:  Discussion and Education  Additional Comments:  Pt attended and participated in group. Discussion was on personal development ans what it means to them. Pt stated personal development means challenges and how to overcome them.  Shelly BombardGarner, Nathifa Ritthaler D 08/16/2013, 6:48 PM

## 2013-08-16 NOTE — Progress Notes (Signed)
D: Patient in the dayroom on [facility].  Patient states he had a good day.  Patient states he found out goof new today.  Patient states he may be able to go back to school to get his GED.  Patient states this made his day.  Patient denies SI/HI and denies AVH. A: Staff to monitor Q 15 mins for safety.  Encouragement and support offered.  Scheduled medications administered per orders. R: Patient remains safe on the unit.  Patient attended group tonight.  Patient visible on the unit and interacting with peers.  Patient taking administered medications.

## 2013-08-17 MED ORDER — LAMOTRIGINE 25 MG PO TABS: 50.0000 mg | ORAL_TABLET | Freq: Two times a day (BID) | ORAL | Status: DC

## 2013-08-17 MED ORDER — LORAZEPAM 1 MG PO TABS: 1.0000 mg | ORAL_TABLET | Freq: Three times a day (TID) | ORAL | Status: DC | PRN

## 2013-08-17 MED ORDER — LISDEXAMFETAMINE DIMESYLATE 30 MG PO CAPS: 30.0000 mg | ORAL_CAPSULE | Freq: Every day | ORAL | Status: DC

## 2013-08-17 MED ORDER — NICOTINE 21 MG/24HR TD PT24: 21.0000 mg | MEDICATED_PATCH | Freq: Every day | TRANSDERMAL | Status: DC

## 2013-08-17 NOTE — [facility] Suicide Risk Assessment (Signed)
Suicide Risk Assessment  Discharge Assessment     Demographic Factors:  Male, Adolescent or young adult, Caucasian, Low socioeconomic status and Unemployed  Mental Status Per Nursing Assessment::   On Admission:  Suicidal ideation indicated by patient  Current Mental Status by Physician: Patient has been calm and cooperative. Patient reportedly has been more active since he was admitted in the hospital. Patient has good mood with the appropriate a bright and full affect. Patient has normal rate rhythm and volume of speech. Patient has denied suicidal/homicidal ideation, intentions and plans. Patient has anemia and goal-directed thought process. Patient has no evidence of psychotic symptoms.  Loss Factors: Decrease in vocational status and Financial problems/change in socioeconomic status  Historical Factors: Family history of mental illness or substance abuse and Impulsivity  Risk Reduction Factors:   Sense of responsibility to family, Religious beliefs about death, Living with another person, especially a relative, Positive social support, Positive therapeutic relationship and Positive coping skills or problem solving skills  Continued Clinical Symptoms:  Bipolar Disorder:   Depressive phase Chronic Pain Previous Psychiatric Diagnoses and Treatments Medical Diagnoses and Treatments/Surgeries  Cognitive Features That Contribute To Risk:  Polarized thinking    Suicide Risk:  Minimal: No identifiable suicidal ideation.  Patients presenting with no risk factors but with morbid ruminations; may be classified as minimal risk based on the severity of the depressive symptoms  Discharge Diagnoses:   AXIS I:  ADHD, combined type and Bipolar, Depressed AXIS II:  Deferred AXIS III:   Past Medical History  Diagnosis Date  . IBS (irritable bowel syndrome)   . Bipolar 1 disorder   . CHO intolerance     high cholesterol, bipolar, anxiety disorder, ADHD  . Anxiety   . ADHD (attention  deficit hyperactivity disorder)   . Irritable bowel syndrome   . Headache(784.0)    AXIS IV:  other psychosocial or environmental problems, problems related to social environment and problems with primary support group AXIS V:  61-70 mild symptoms  Plan Of Care/Follow-up recommendations:  Activity:  As tolerated Diet:   Regular  Is patient on multiple antipsychotic therapies at discharge:  No   Has Patient had three or more failed trials of antipsychotic monotherapy by history:  No  Recommended Plan for Multiple Antipsychotic Therapies: NA  Mateusz Neilan,JANARDHAHA R. 08/17/2013, 11:10 AM
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
## 2013-08-17 NOTE — Progress Notes (Signed)
[facility] :  Will you be returning to the same living situation after discharge: Yes,  returning home At discharge, do you have transportation home?:Yes,  family will pick pt up Do you have the ability to pay for your medications:Yes,  access to meds  Release of information consent forms completed and in the chart;  Patient's signature needed at discharge.  Patient to Follow up at: Follow-up Information   Follow up with [facility] On 09/15/2013. (Appointment scheduled at 2:00 pm with Dr. Jannifer FranklinAkintayo for medication management)    Contact information:   [address] [place], [place] [address] Phone: [phone] Fax: [phone]      Patient denies SI/HI:   Yes,  denies SI/HI    Safety Planning and Suicide Prevention discussed:  Yes,  discussed with pt and pt's mother.  See suicide prevention education note.   Dennis MillerHorton, [name] 08/17/2013, 10:03 AM

## 2013-08-17 NOTE — Progress Notes (Signed)
Patient ID: [name], male   DOB: [birth year], 18 y.o.   MRN: [id number]  Morning wellness group 9:00 AM  The focus of this group is to educate the patient on the purpose and policies of crisis stabilization and provide a format to answer questions about their admission.  The group details unit policies and expectations of patients while admitted.   Patient attended group and participated in group exercises. Patient was engaged and group and stated that he usually likes to be out in nature to relax and decrease stress. Patient states that a coping skill he learned was to use a punching bag to get out his aggression rather than punching walls.

## 2013-08-17 NOTE — Progress Notes (Signed)
Adult Psychoeducational Group Note  Date:  08/17/2013 Time:  11:15 AM  Group Topic/Focus:  Building Self Esteem:   The Focus of this group is helping patients become aware of the effects of self-esteem on their lives, the things they and others do that enhance or undermine their self-esteem, seeing the relationship between their level of self-esteem and the choices they make and learning ways to enhance self-esteem.  Participation Level:  Active  Participation Quality:  Attentive  Affect:  Appropriate  Cognitive:  Appropriate  Insight: Appropriate  Engagement in Group:  Engaged  Modes of Intervention:  Discussion, Education and Support  Additional Comments:  Pt attended group and participated.   Aaliyan Brinkmeier P 08/17/2013, 11:15 AM

## 2013-08-17 NOTE — Progress Notes (Signed)
Patient ID: [name], male   DOB: 09-12-1995, 18 y.o.   MRN: [id number]  Pt. Denies SI/HI and A/V hallucinations. Belongings returned to patient at time of discharge. Patient denies any pain or discomfort.  Patient rates his depression and hopelessness for the day. Discharge instructions and medications were reviewed with patient. Patient verbalized understanding of both medications and discharge instructions. Support and encouragement provided to the patient. Patient is receptive and cooperative. Q15 minute checks were maintained for safety until discharge. No distress noted upon discharge.

## 2013-08-22 NOTE — Progress Notes (Signed)
Patient Discharge Instructions:  After Visit Summary (AVS):   Faxed to:  08/22/13 Discharge Summary Note:   Faxed to:  08/22/13 Psychiatric Admission Assessment Note:   Faxed to:  08/22/13 Suicide Risk Assessment - Discharge Assessment:   Faxed to:  08/22/13 Faxed/Sent to the Next Level Care provider:  08/22/13 Faxed to [facility] @ [phone]  Jerelene ReddenSheena E Ponderay, 08/22/2013, 3:53 PM

## 2013-08-25 ENCOUNTER — Encounter (HOSPITAL_COMMUNITY): Payer: Self-pay | Admitting: Emergency Medicine

## 2013-08-25 ENCOUNTER — Emergency Department (HOSPITAL_COMMUNITY)
Admission: EM | Admit: 2013-08-25 | Discharge: 2013-08-26 | Disposition: A | Discharge status: Home / Self Care | Payer: Medicaid Other | Attending: Emergency Medicine | Admitting: Emergency Medicine

## 2013-08-25 DIAGNOSIS — L299 Pruritus, unspecified: Secondary | ICD-10-CM | POA: Insufficient documentation

## 2013-08-25 DIAGNOSIS — F909 Attention-deficit hyperactivity disorder, unspecified type: Secondary | ICD-10-CM | POA: Insufficient documentation

## 2013-08-25 DIAGNOSIS — F319 Bipolar disorder, unspecified: Secondary | ICD-10-CM | POA: Insufficient documentation

## 2013-08-25 DIAGNOSIS — F411 Generalized anxiety disorder: Secondary | ICD-10-CM | POA: Insufficient documentation

## 2013-08-25 DIAGNOSIS — Z8719 Personal history of other diseases of the digestive system: Secondary | ICD-10-CM | POA: Insufficient documentation

## 2013-08-25 DIAGNOSIS — Z79899 Other long term (current) drug therapy: Secondary | ICD-10-CM | POA: Insufficient documentation

## 2013-08-25 DIAGNOSIS — F17200 Nicotine dependence, unspecified, uncomplicated: Secondary | ICD-10-CM | POA: Insufficient documentation

## 2013-08-25 DIAGNOSIS — R21 Rash and other nonspecific skin eruption: Secondary | ICD-10-CM | POA: Insufficient documentation

## 2013-08-25 DIAGNOSIS — E7800 Pure hypercholesterolemia, unspecified: Secondary | ICD-10-CM | POA: Insufficient documentation

## 2013-08-25 DIAGNOSIS — T4275XA Adverse effect of unspecified antiepileptic and sedative-hypnotic drugs, initial encounter: Secondary | ICD-10-CM | POA: Insufficient documentation

## 2013-08-25 NOTE — ED Notes (Signed)
The pt has a rash over his body for 3 days.  He thinks it is the lamictal  He was given to take from mental health.  He stopped taking it 3 days ago and the rash is still there

## 2013-08-26 MED ORDER — RANITIDINE HCL 150 MG PO CAPS: 150.0000 mg | ORAL_CAPSULE | Freq: Every day | ORAL | Status: DC

## 2013-08-26 MED ORDER — DIPHENHYDRAMINE HCL 25 MG PO TABS: 25.0000 mg | ORAL_TABLET | Freq: Four times a day (QID) | ORAL | Status: DC | PRN

## 2013-08-26 MED ORDER — HYDROCORTISONE 1 % EX CREA: 1.0000 "application " | TOPICAL_CREAM | Freq: Two times a day (BID) | CUTANEOUS | Status: DC

## 2013-08-26 NOTE — ED Provider Notes (Signed)
CSN: [id number]     Arrival date & time 08/25/13  2228 History   First MD Initiated Contact with Patient 08/25/13 2314     Chief Complaint  Patient presents with  . Rash   (Consider location/radiation/quality/duration/timing/severity/associated sxs/prior Treatment) Patient is a 18 y.o. male presenting with rash. The history is provided by the patient. No language interpreter was used.  Rash Location:  Full body Quality: burning, draining, itchiness and redness   Quality: not scaling, not swelling and not weeping   Severity:  Mild Onset quality:  Gradual Duration:  3 days Timing:  Constant Progression:  Worsening Chronicity:  New (onset 48 hours after starting Lamictal for bipolar d/o) Context: medications   Context: not exposure to similar rash, not insect bite/sting, not new detergent/soap and not sick contacts   Relieved by:  Nothing Worsened by:  Nothing tried Ineffective treatments: benadryl. Associated symptoms: no abdominal pain, no diarrhea, no fever, no hoarse voice, no myalgias, no periorbital edema, no shortness of breath, no sore throat, no throat swelling, no tongue swelling, not vomiting and not wheezing     Past Medical History  Diagnosis Date  . IBS (irritable bowel syndrome)   . Bipolar 1 disorder   . CHO intolerance     high cholesterol, bipolar, anxiety disorder, ADHD  . Anxiety   . ADHD (attention deficit hyperactivity disorder)   . Irritable bowel syndrome   . GUYQIHKV(425.9)    Past Surgical History  Procedure Laterality Date  . Hydrocele excision / repair  2005   No family history on file. History  Substance Use Topics  . Smoking status: Current Every Day Smoker -- 1.50 packs/day for 3 years    Types: Cigarettes  . Smokeless tobacco: Current User    Types: Snuff, Chew  . Alcohol Use: Yes     Comment: Few beers per month    Review of Systems  Constitutional: Negative for fever.  HENT: Negative for hoarse voice and sore throat.    Respiratory: Negative for shortness of breath and wheezing.   Gastrointestinal: Negative for vomiting, abdominal pain and diarrhea.  Musculoskeletal: Negative for myalgias.  Skin: Positive for rash.  Neurological: Negative for weakness and numbness.  All other systems reviewed and are negative.    Allergies  Calamari oil; Other; and Lamictal  Home Medications   Current Outpatient Rx  Name  Route  Sig  Dispense  Refill  . lisdexamfetamine (VYVANSE) 30 MG capsule   Oral   Take 1 capsule (30 mg total) by mouth daily.   30 capsule   0   . LORazepam (ATIVAN) 1 MG tablet   Oral   Take 1 tablet (1 mg total) by mouth every 8 (eight) hours as needed for anxiety.   10 tablet   0   . diphenhydrAMINE (BENADRYL) 25 MG tablet   Oral   Take 1 tablet (25 mg total) by mouth every 6 (six) hours as needed for itching (Rash).   30 tablet   0   . hydrocortisone cream 1 %   Topical   Apply 1 application topically 2 (two) times daily. Do not apply to face   15 g   1   . ranitidine (ZANTAC) 150 MG capsule   Oral   Take 1 capsule (150 mg total) by mouth daily.   30 capsule   0    BP 125/61  Pulse 90  Temp(Src) 98.1 F (36.7 C)  Resp 18  Ht 6' (1.829 m)  Wt 236  lb (107.049 kg)  BMI 32.00 kg/m2  SpO2 98% Physical Exam  Nursing note and vitals reviewed. Constitutional: He is oriented to person, place, and time. He appears well-developed and well-nourished. No distress.  HENT:  Head: Normocephalic and atraumatic.  Mouth/Throat: Oropharynx is clear and moist. No oropharyngeal exudate.  Uvula midline. Airway patent. Patient tolerating secretions without difficulty.  Eyes: Conjunctivae and EOM are normal. Pupils are equal, round, and reactive to light. No scleral icterus.  Neck: Normal range of motion. Neck supple.  Cardiovascular: Normal rate, regular rhythm and normal heart sounds.   Pulmonary/Chest: Effort normal and breath sounds normal. No stridor. No respiratory distress. He  has no wheezes. He has no rales.  Musculoskeletal: Normal range of motion.  Neurological: He is alert and oriented to person, place, and time.  GCS 15. Patient moves extremities without ataxia. No gross sensory deficits appreciated.  Skin: Skin is warm and dry. Rash noted. He is not diaphoretic. No erythema. No pallor.  Rash on b/l extremities, torso, and back. Planar papules, mildly erythematous, and pruritic; scabbed secondary to scratching. No bullae, vesicles, pustules, skin peeling, weeping, or drainage.   Psychiatric: He has a normal mood and affect. His behavior is normal.    ED Course  Procedures (including critical care time) Labs Review Labs Reviewed - No data to display  Imaging Review No results found.  EKG Interpretation   None       MDM   1. Rash and nonspecific skin eruption    Uncomplicated rash - Patient well and nontoxic appearing, hemodynamically stable, and afebrile. No angioedema. No evidence of airway compromise. Not concerning for SJS, erythema multiforme major, erythema multiforme minor. Stable for d/c with Benadryl, Zantac, topical hydrocortisone, and instruction to d/c Lamictal. Return precautions discussed and patient agreeable to plan with no unaddressed concerns.    Antonietta Breach, PA-C 08/26/13 [phone]

## 2013-08-26 NOTE — Discharge Instructions (Signed)
Use benadryl and zantac for itching. Use hydrocortisone as prescribed; do not use on face. Stop taking Lamictal. Follow up with your primary care doctor.  Rash A rash is a change in the color or texture of your skin. There are many different types of rashes. You may have other problems that accompany your rash. CAUSES   Infections.  Allergic reactions. This can include allergies to pets or foods.  Certain medicines.  Exposure to certain chemicals, soaps, or cosmetics.  Heat.  Exposure to poisonous plants.  Tumors, both cancerous and noncancerous. SYMPTOMS   Redness.  Scaly skin.  Itchy skin.  Dry or cracked skin.  Bumps.  Blisters.  Pain. DIAGNOSIS  Your caregiver may do a physical exam to determine what type of rash you have. A skin sample (biopsy) may be taken and examined under a microscope. TREATMENT  Treatment depends on the type of rash you have. Your caregiver may prescribe certain medicines. For serious conditions, you may need to see a skin doctor (dermatologist). HOME CARE INSTRUCTIONS   Avoid the substance that caused your rash.  Do not scratch your rash. This can cause infection.  You may take cool baths to help stop itching.  Only take over-the-counter or prescription medicines as directed by your caregiver.  Keep all follow-up appointments as directed by your caregiver. SEEK IMMEDIATE MEDICAL CARE IF:  You have increasing pain, swelling, or redness.  You have a fever.  You have new or severe symptoms.  You have body aches, diarrhea, or vomiting.  Your rash is not better after 3 days. MAKE SURE YOU:  Understand these instructions.  Will watch your condition.  Will get help right away if you are not doing well or get worse. Document Released: 07/17/2002 Document Revised: 10/19/2011 Document Reviewed: 05/11/2011 [facility] Patient Information 2014 [place], [place].

## 2013-08-26 NOTE — ED Notes (Signed)
Attempted to obtain e-signature.  Signature pad not working.  Pt verbalized understanding of d/c and f/u.  No additional questions by pt.

## 2013-08-27 NOTE — ED Provider Notes (Signed)
Medical screening examination/treatment/procedure(s) were performed by non-physician practitioner and as supervising physician I was immediately available for consultation/collaboration.  EKG Interpretation   None         Floretta Petro, MD 08/27/13 0210 

## 2013-09-12 ENCOUNTER — Ambulatory Visit: Payer: Medicaid Other | Attending: Orthopedic Surgery | Admitting: Physical Therapy

## 2013-09-12 DIAGNOSIS — M545 Low back pain, unspecified: Secondary | ICD-10-CM | POA: Insufficient documentation

## 2013-09-12 DIAGNOSIS — R5381 Other malaise: Secondary | ICD-10-CM | POA: Insufficient documentation

## 2013-09-12 DIAGNOSIS — M25659 Stiffness of unspecified hip, not elsewhere classified: Secondary | ICD-10-CM | POA: Insufficient documentation

## 2013-10-13 ENCOUNTER — Ambulatory Visit: Payer: Medicaid Other | Attending: Orthopedic Surgery

## 2013-10-13 DIAGNOSIS — M545 Low back pain, unspecified: Secondary | ICD-10-CM | POA: Insufficient documentation

## 2013-10-13 DIAGNOSIS — R5381 Other malaise: Secondary | ICD-10-CM | POA: Insufficient documentation

## 2013-10-13 DIAGNOSIS — M25659 Stiffness of unspecified hip, not elsewhere classified: Secondary | ICD-10-CM | POA: Insufficient documentation

## 2013-10-20 ENCOUNTER — Ambulatory Visit: Payer: Medicaid Other

## 2014-02-25 ENCOUNTER — Encounter (HOSPITAL_COMMUNITY): Payer: Self-pay | Admitting: Emergency Medicine

## 2014-02-25 ENCOUNTER — Emergency Department (HOSPITAL_COMMUNITY)
Admission: EM | Admit: 2014-02-25 | Discharge: 2014-02-26 | Disposition: A | Discharge status: Home / Self Care | Payer: Medicaid Other | Attending: Emergency Medicine | Admitting: Emergency Medicine

## 2014-02-25 DIAGNOSIS — W5651XA Bitten by other fish, initial encounter: Secondary | ICD-10-CM

## 2014-02-25 DIAGNOSIS — Z79899 Other long term (current) drug therapy: Secondary | ICD-10-CM | POA: Insufficient documentation

## 2014-02-25 DIAGNOSIS — T6391XA Toxic effect of contact with unspecified venomous animal, accidental (unintentional), initial encounter: Secondary | ICD-10-CM | POA: Diagnosis not present

## 2014-02-25 DIAGNOSIS — Y929 Unspecified place or not applicable: Secondary | ICD-10-CM | POA: Insufficient documentation

## 2014-02-25 DIAGNOSIS — S60949A Unspecified superficial injury of unspecified finger, initial encounter: Secondary | ICD-10-CM | POA: Diagnosis present

## 2014-02-25 DIAGNOSIS — F411 Generalized anxiety disorder: Secondary | ICD-10-CM | POA: Diagnosis not present

## 2014-02-25 DIAGNOSIS — F17200 Nicotine dependence, unspecified, uncomplicated: Secondary | ICD-10-CM | POA: Diagnosis not present

## 2014-02-25 DIAGNOSIS — Z8719 Personal history of other diseases of the digestive system: Secondary | ICD-10-CM | POA: Insufficient documentation

## 2014-02-25 DIAGNOSIS — Y939 Activity, unspecified: Secondary | ICD-10-CM | POA: Diagnosis not present

## 2014-02-25 MED ORDER — BACITRACIN ZINC 500 UNIT/GM EX OINT: 1.0000 "application " | TOPICAL_OINTMENT | Freq: Two times a day (BID) | CUTANEOUS | Status: AC

## 2014-02-25 MED ORDER — DIPHENHYDRAMINE HCL 25 MG PO CAPS: 25.0000 mg | ORAL_CAPSULE | Freq: Four times a day (QID) | ORAL | Status: DC | PRN

## 2014-02-25 NOTE — Discharge Instructions (Signed)
Please return to the ED if you have any urgent health concerns, worsening symptoms, or concerns for wound infection.

## 2014-02-25 NOTE — ED Notes (Signed)
The pt was bitten by a fish earlier today he has pain and sl swelling to his rt ring finger.  Headache with nv since he was bitten  And he still has pain.

## 2014-02-26 MED ORDER — HYDROXYZINE HCL 25 MG PO TABS: 25.0000 mg | ORAL_TABLET | Freq: Four times a day (QID) | ORAL | Status: AC

## 2014-02-26 NOTE — ED Provider Notes (Signed)
CSN: [id number]     Arrival date & time 02/25/14  2249 History   First MD Initiated Contact with Patient 02/25/14 2309     Chief Complaint  Patient presents with  . fish bite      (Consider location/radiation/quality/duration/timing/severity/associated sxs/prior Treatment) HPI This young man is a right-hand-dominant gentleman who believes that he sustained a very superficial wound from a tooth catfish which he had caught. The injury occurred at 3 PM. The patient says that he is aware History toxin. His mother wanted him to get checked out.  Patient has felt like his right hand is swollen. The patient's mother says feels like the patient's entire or right-sided body is swollen. Patient denies experiencing shortness of breath, wheezing, nausea and vomiting. He has subjective weakness of the right extremity his tetanus is up-to-date.   Past Medical History  Diagnosis Date  . IBS (irritable bowel syndrome)   . Bipolar 1 disorder   . CHO intolerance     high cholesterol, bipolar, anxiety disorder, ADHD  . Anxiety   . ADHD (attention deficit hyperactivity disorder)   . Irritable bowel syndrome   . WUJWJXBJ(478.2)    Past Surgical History  Procedure Laterality Date  . Hydrocele excision / repair  2005   No family history on file. History  Substance Use Topics  . Smoking status: Current Every Day Smoker -- 1.50 packs/day for 3 years    Types: Cigarettes  . Smokeless tobacco: Current User    Types: Snuff, Chew  . Alcohol Use: Yes     Comment: Few beers per month    Review of Systems  Ten point review of symptoms performed and is negative with the exception of symptoms noted above.   Allergies  Calamari oil; Other; and Lamictal  Home Medications   Prior to Admission medications   Medication Sig Start Date End Date Taking? Authorizing Provider  acetaminophen (TYLENOL) 500 MG tablet Take 3,000 mg by mouth once.   Yes Historical Provider, MD  CAFFEINE PO Take 1 tablet by  mouth once.   Yes Historical Provider, MD  gabapentin (NEURONTIN) 600 MG tablet Take 600 mg by mouth daily as needed (for pain).   Yes Historical Provider, MD  PB-Hyoscy-Atropine-Scopolamine (DONNATAL PO) Take 1 Bottle by mouth every 6 (six) hours as needed (for stoamch pain. Samples from MD.).   Yes Historical Provider, MD  promethazine (PHENERGAN) 25 MG tablet Take 25 mg by mouth every 8 (eight) hours as needed for nausea or vomiting.   Yes Historical Provider, MD  bacitracin ointment Apply 1 application topically 2 (two) times daily. 02/25/14   Brandt Loosen, MD  hydrOXYzine (ATARAX/VISTARIL) 25 MG tablet Take 1 tablet (25 mg total) by mouth every 6 (six) hours. 02/26/14   Brandt Loosen, MD   BP 145/60  Pulse 80  Temp(Src) 98.6 F (37 C)  Resp 18  Ht 6\' 1"  (1.854 m)  Wt 218 lb (98.884 kg)  BMI 28.77 kg/m2  SpO2 98% Physical Exam Gen: well developed and well nourished appearing, poorly groomed Head: NCAT Eyes: PERL, EOMI Nose: no epistaixis or rhinorrhea Mouth/throat: mucosa is moist and pink Neck: supple, no stridor Lungs: CTA B, no wheezing, rhonchi or rales CV: RRR, no murmur, extremities appear well perfused.  Abd: soft, notender, nondistended, obese Back: To inspection Skin: warm and dry Ext: Right upper extremity: There is a very superficial appearing punctate wound over the volar aspect of the distal right fourth finger. I cannot appreciate any soft tissue swelling. Sensation  is intact to light touch throughout, there is normal strength at all joints of all fingers bilaterally, cap refill is less than 2 seconds. There is a strong radial pulse. Neuro: CN ii-xii grossly intact, no focal deficits, normal speech. Psyche; normal affect,  calm and cooperative.   ED Course  Procedures (including critical care time) Labs Review  A single ear gave way and apply bacitracin. Patient's tetanus is up-to-date.  I counseled the patient regarding signs and symptoms of infection and asked  that he return and be [facility] at [facility] if he has any concerns for this. He says that Benadryl makes him feel weird. Will prescribe Vistaril symptomatic management, as needed along with topical bacitracin..  MDM   Final diagnoses:  Fish bite wound        Brandt LoosenJulie Chiron Campione, MD 02/26/14 0006

## 2014-03-10 ENCOUNTER — Encounter (HOSPITAL_COMMUNITY): Payer: Self-pay | Admitting: Emergency Medicine

## 2014-03-10 ENCOUNTER — Emergency Department (HOSPITAL_COMMUNITY)
Admission: EM | Admit: 2014-03-10 | Discharge: 2014-03-10 | Disposition: A | Discharge status: Home / Self Care | Payer: Medicaid Other | Attending: Emergency Medicine | Admitting: Emergency Medicine

## 2014-03-10 ENCOUNTER — Emergency Department (HOSPITAL_COMMUNITY): Payer: Medicaid Other

## 2014-03-10 DIAGNOSIS — Z8659 Personal history of other mental and behavioral disorders: Secondary | ICD-10-CM | POA: Diagnosis not present

## 2014-03-10 DIAGNOSIS — Z8719 Personal history of other diseases of the digestive system: Secondary | ICD-10-CM | POA: Diagnosis not present

## 2014-03-10 DIAGNOSIS — IMO0002 Reserved for concepts with insufficient information to code with codable children: Secondary | ICD-10-CM | POA: Diagnosis not present

## 2014-03-10 DIAGNOSIS — F17200 Nicotine dependence, unspecified, uncomplicated: Secondary | ICD-10-CM | POA: Diagnosis not present

## 2014-03-10 DIAGNOSIS — F411 Generalized anxiety disorder: Secondary | ICD-10-CM | POA: Insufficient documentation

## 2014-03-10 DIAGNOSIS — Y9289 Other specified places as the place of occurrence of the external cause: Secondary | ICD-10-CM | POA: Diagnosis not present

## 2014-03-10 DIAGNOSIS — Z79899 Other long term (current) drug therapy: Secondary | ICD-10-CM | POA: Insufficient documentation

## 2014-03-10 DIAGNOSIS — Y9389 Activity, other specified: Secondary | ICD-10-CM | POA: Insufficient documentation

## 2014-03-10 DIAGNOSIS — Z87828 Personal history of other (healed) physical injury and trauma: Secondary | ICD-10-CM | POA: Insufficient documentation

## 2014-03-10 DIAGNOSIS — W010XXA Fall on same level from slipping, tripping and stumbling without subsequent striking against object, initial encounter: Secondary | ICD-10-CM | POA: Diagnosis not present

## 2014-03-10 DIAGNOSIS — M549 Dorsalgia, unspecified: Secondary | ICD-10-CM

## 2014-03-10 DIAGNOSIS — S3992XA Unspecified injury of lower back, initial encounter: Secondary | ICD-10-CM

## 2014-03-10 DIAGNOSIS — G8929 Other chronic pain: Secondary | ICD-10-CM | POA: Insufficient documentation

## 2014-03-10 HISTORY — DX: Unspecified injury to unspecified level of lumbar spinal cord, initial encounter: S34.109A

## 2014-03-10 MED ORDER — TRAMADOL HCL 50 MG PO TABS: 50.0000 mg | ORAL_TABLET | Freq: Four times a day (QID) | ORAL | Status: AC | PRN

## 2014-03-10 MED ORDER — HYDROCODONE-ACETAMINOPHEN 5-325 MG PO TABS
1.0000 | ORAL_TABLET | Freq: Once | ORAL | Status: AC
  Administered 2014-03-10: 1 via ORAL
  Filled 2014-03-10: qty 1

## 2014-03-10 NOTE — ED Notes (Signed)
Patient here with increased back pain from fall early this morning. Patient states that he slipped on some steps and landed on his back. Endorses previous chronic back pain secondary to lumbar spinal fractures. Patient ambulatory in triage but with pain.

## 2014-03-10 NOTE — Discharge Instructions (Signed)
Continue tylenol. Ultram for severe pain. Follow up with your doctor. Your Xrays are unchanged from prior.    Back Pain, Adult Low back pain is very common. About 1 in 5 people have back pain.The cause of low back pain is rarely dangerous. The pain often gets better over time.About half of people with a sudden onset of back pain feel better in just 2 weeks. About 8 in 10 people feel better by 6 weeks.  CAUSES Some common causes of back pain include:  Strain of the muscles or ligaments supporting the spine.  Wear and tear (degeneration) of the spinal discs.  Arthritis.  Direct injury to the back. DIAGNOSIS Most of the time, the direct cause of low back pain is not known.However, back pain can be treated effectively even when the exact cause of the pain is unknown.Answering your caregiver's questions about your overall health and symptoms is one of the most accurate ways to make sure the cause of your pain is not dangerous. If your caregiver needs more information, he or she may order lab work or imaging tests (X-rays or MRIs).However, even if imaging tests show changes in your back, this usually does not require surgery. HOME CARE INSTRUCTIONS For many people, back pain returns.Since low back pain is rarely dangerous, it is often a condition that people can learn to [facility] Centersmanageon their own.   Remain active. It is stressful on the back to sit or stand in one place. Do not sit, drive, or stand in one place for more than 30 minutes at a time. Take short walks on level surfaces as soon as pain allows.Try to increase the length of time you walk each day.  Do not stay in bed.Resting more than 1 or 2 days can delay your recovery.  Do not avoid exercise or work.Your body is made to move.It is not dangerous to be active, even though your back may hurt.Your back will likely heal faster if you return to being active before your pain is gone.  Pay attention to your body when you bend and lift.  Many people have less discomfortwhen lifting if they bend their knees, keep the load close to their bodies,and avoid twisting. Often, the most comfortable positions are those that put less stress on your recovering back.  Find a comfortable position to sleep. Use a firm mattress and lie on your side with your knees slightly bent. If you lie on your back, put a pillow under your knees.  Only take over-the-counter or prescription medicines as directed by your caregiver. Over-the-counter medicines to reduce pain and inflammation are often the most helpful.Your caregiver may prescribe muscle relaxant drugs.These medicines help dull your pain so you can more quickly return to your normal activities and healthy exercise.  Put ice on the injured area.  Put ice in a plastic bag.  Place a towel between your skin and the bag.  Leave the ice on for 15-20 minutes, 03-04 times a day for the first 2 to 3 days. After that, ice and heat may be alternated to reduce pain and spasms.  Ask your caregiver about trying back exercises and gentle massage. This may be of some benefit.  Avoid feeling anxious or stressed.Stress increases muscle tension and can worsen back pain.It is important to recognize when you are anxious or stressed and learn ways to manage it.Exercise is a great option. SEEK MEDICAL CARE IF:  You have pain that is not relieved with rest or medicine.  You have pain  that does not improve in 1 week.  You have new symptoms.  You are generally not feeling well. SEEK IMMEDIATE MEDICAL CARE IF:   You have pain that radiates from your back into your legs.  You develop new bowel or bladder control problems.  You have unusual weakness or numbness in your arms or legs.  You develop nausea or vomiting.  You develop abdominal pain.  You feel faint. Document Released: 07/27/2005 Document Revised: 01/26/2012 Document Reviewed: 11/28/2013 [facility] Patient Information 2015 [place], [place].  This information is not intended to replace advice given to you by your health care provider. Make sure you discuss any questions you have with your health care provider.

## 2014-03-10 NOTE — ED Provider Notes (Signed)
CSN: [id number]     Arrival date & time 03/10/14  2043 History  This chart was scribed for Jaynie Crumble, PA-C, working with Flint Melter, MD by Chestine Spore, ED Scribe. The patient was seen in room TR04C/TR04C at 9:26 PM.     Chief Complaint  Patient presents with  . Back Pain     The history is provided by the patient. No language interpreter was used.   HPI Comments: [name]. is a 18 y.o. male who presents to the Emergency Department complaining of back pain onset this morning. He states that he slipped and fell this morning going down some steps and landed on his back. He states that he has taken 5-8 325 mg of Tylenol today with no relief for his symptoms. He denies numbness, fever, weakness, bowel or urine incontinence, and any other associated symptoms.  He states that he has lumbar spinal fractures in the past.   Past Medical History  Diagnosis Date  . IBS (irritable bowel syndrome)   . Bipolar 1 disorder   . CHO intolerance     high cholesterol, bipolar, anxiety disorder, ADHD  . Anxiety   . ADHD (attention deficit hyperactivity disorder)   . Irritable bowel syndrome   . Headache(784.0)   . 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 Hydrocele excision / repair  2005  . Back surgery     No family history on file. History  Substance Use Topics  . Smoking status: Current Every Day Smoker -- 1.50 packs/day for 3 years    Types: Cigarettes  . Smokeless tobacco: Current User    Types: Snuff, Chew  . Alcohol Use: Yes     Comment: Few beers per month    Review of Systems  Constitutional: Negative for fever.  Gastrointestinal:       No bowel incontinence  Genitourinary:       No urinary incontinence  Musculoskeletal: Positive for back pain.  Neurological: Negative for weakness and numbness.    Allergies  Calamari oil; Other; Klonopin; and Lamictal  Home Medications   Prior to Admission  medications   Medication Sig Start Date End Date Taking? Authorizing Provider  acetaminophen (TYLENOL) 500 MG tablet Take 3,000 mg by mouth once.    Historical Provider, MD  bacitracin ointment Apply 1 application topically 2 (two) times daily. 02/25/14   Brandt Loosen, MD  CAFFEINE PO Take 1 tablet by mouth once.    Historical Provider, MD  gabapentin (NEURONTIN) 600 MG tablet Take 600 mg by mouth daily as needed (for pain).    Historical Provider, MD  hydrOXYzine (ATARAX/VISTARIL) 25 MG tablet Take 1 tablet (25 mg total) by mouth every 6 (six) hours. 02/26/14   Brandt Loosen, MD  PB-Hyoscy-Atropine-Scopolamine (DONNATAL PO) Take 1 Bottle by mouth every 6 (six) hours as needed (for stoamch pain. Samples from MD.).    Historical Provider, MD  promethazine (PHENERGAN) 25 MG tablet Take 25 mg by mouth every 8 (eight) hours as needed for nausea or vomiting.    Historical Provider, MD   BP 124/71  Pulse 95  Temp(Src) 98.9 F (37.2 C) (Oral)  Resp 18  SpO2 96%  Physical Exam  Nursing note and vitals reviewed. Constitutional: He is oriented to person, place, and time. He appears well-developed and well-nourished. No distress.  HENT:  Head: Normocephalic and atraumatic.  Eyes: EOM are normal.  Neck: Neck supple. No tracheal  deviation present.  Cardiovascular: Normal rate and intact distal pulses.   Pulmonary/Chest: Effort normal. No respiratory distress.  Musculoskeletal: Normal range of motion.  Midline lumbar spine tenderness, mild bilateral paravertebral lumbar spine tenderness. No pain with bilateral straight leg raise. No bruising or, swelling, discoloration noted over the patient's lower back.  Neurological: He is alert and oriented to person, place, and time.  5/5 and equal lower extremity strength. 2+ and equal patellar reflexes bilaterally. Pt able to dorsiflex bilateral toes and feet with good strength against resistance. Equal sensation bilaterally over thighs and lower legs.   Skin:  Skin is warm and dry.  Psychiatric: He has a normal mood and affect. His behavior is normal.    ED Course  Procedures (including critical care time) DIAGNOSTIC STUDIES: Oxygen Saturation is 96% on room air, normal by my interpretation.    COORDINATION OF CARE: 9:30 PM-Discussed treatment plan which includes Hydrocodone with pt at bedside and pt agreed to plan.   Labs Review Labs Reviewed - No data to display  Imaging Review Dg Lumbar Spine Complete  03/10/2014   CLINICAL DATA:  Low back pain after a fall today. Previous fractures of L2 and L3-3 years ago.  EXAM: LUMBAR SPINE - COMPLETE 4+ VIEW  COMPARISON:  Lumbar spine 04/10/2012. 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 Minimal anterior wedging of T10 and T11 vertebrae, unchanged since prior study. Normal alignment of the lumbar spine. Intervertebral disc space heights are mostly preserved although there is slight narrowing at L3-4. No focal bone lesion or bone destruction. Bone cortex and trabecular architecture appear intact. No change since prior study.  IMPRESSION: Normal alignment. No acute bony abnormalities. Mild anterior wedging of T10 and T11 vertebra and a bilateral L5 spondylolysis. No change since prior study.   Electronically Signed   By: Burman NievesWilliam  Stevens M.D.   On: 03/10/2014 22:03     EKG Interpretation None      MDM   Final diagnoses:  Lower back injury, initial encounter  Chronic back pain    Patient with history of lumbar spine fracture, here with acute injury after falling and hitting his back on a step. X-rays of lumbar spine obtained with no acute findings. Patient is neurovascularly intact, no signs of cauda equina. Patient does have followup. He was given a Norco for pain in emergency department, will discharge home with tramadol. Continue Tylenol. Follow with primary care Dr.  Ceasar MonsFiled Vitals:   03/10/14 2055  BP: 124/71  Pulse: 95   Temp: 98.9 F (37.2 C)  TempSrc: Oral  Resp: 18  SpO2: 96%    I personally performed the services described in this documentation, which was scribed in my presence. The recorded information has been reviewed and is accurate.    Lottie Musselatyana A Bitania Shankland, PA-C 03/10/14 2325

## 2014-03-11 NOTE — ED Provider Notes (Signed)
Medical screening examination/treatment/procedure(s) were performed by non-physician practitioner and as supervising physician I was immediately available for consultation/collaboration.   EKG Interpretation None       Flint MelterElliott L Hulbert Branscome, MD 03/11/14 [phone]

## 2014-03-21 ENCOUNTER — Ambulatory Visit (HOSPITAL_COMMUNITY)
Admission: RE | Admit: 2014-03-21 | Discharge: 2014-03-21 | Disposition: A | Discharge status: Home / Self Care | Payer: Medicaid Other | Attending: Psychiatry | Admitting: Psychiatry

## 2014-03-21 DIAGNOSIS — F3160 Bipolar disorder, current episode mixed, unspecified: Secondary | ICD-10-CM | POA: Insufficient documentation

## 2014-03-21 DIAGNOSIS — F909 Attention-deficit hyperactivity disorder, unspecified type: Secondary | ICD-10-CM | POA: Insufficient documentation

## 2014-03-21 DIAGNOSIS — F17200 Nicotine dependence, unspecified, uncomplicated: Secondary | ICD-10-CM | POA: Insufficient documentation

## 2014-03-21 DIAGNOSIS — F411 Generalized anxiety disorder: Secondary | ICD-10-CM | POA: Diagnosis not present

## 2014-03-21 DIAGNOSIS — K589 Irritable bowel syndrome without diarrhea: Secondary | ICD-10-CM | POA: Diagnosis not present

## 2014-03-21 DIAGNOSIS — F489 Nonpsychotic mental disorder, unspecified: Secondary | ICD-10-CM | POA: Diagnosis present

## 2014-03-21 NOTE — BH Assessment (Signed)
Assessment Note  Dennis BallerMark A Yeo Jr. is an 18 y.o. male.  -Pt [facility] at 02:00 in the morning.  Pt complains of racing thoughts "that I can't explain."  Patient reports that this has been this way for the last two weeks.  Patient says he has had less than 5 hours of sleep in the last three days.  His mother wanted him to come in to be assessed.    Patient denies SI.  He does say that he wishes "it would end."  When asked for clarification he says the racing thoughts.  Denies plan or intention to harm self.  Patient does have a previous history of attempt a few years ago and [facility] three years.  Patient denies any HI or A/V hallucinations.  Patient is unable to pinpoint a specific stressor other than the suicide of his brother in law's younger brother.  He said that it was on 11-24-13 and he was friends with this person.  Patient has racing thoughts but describes his anxiety as low.  Patient smiles and laughs at times during interview.  Patient demeanor is at times incongruent with stated emotional condition.  Patient is able to contract for safety.   He did see Dr. Jannifer FranklinAkintayo for about three months and stopped seeing him two months ago.  Stopped because the Abilify was causing weight gain.  Patient does not currently have a psychiatrist.  Patient said that his MGM has offered to take him to get a new one.  [facility] but chooses not to go there anymore.    -Patient care discussed with Donell SievertSpencer Simon, PA who recommends outpatient care and return home after signing a no harm contract.  Patient was given outpatient referrals and signed a no harm contract.  He declined the MSE.  Patient returned home and will follow up on referrals.  Axis I: Bipolar, mixed Axis II: Deferred Axis III:  Past Medical History  Diagnosis Date  . IBS (irritable bowel syndrome)   . Bipolar 1 disorder   . CHO intolerance     high cholesterol, bipolar,  anxiety disorder, ADHD  . Anxiety   . ADHD (attention deficit hyperactivity disorder)   . Irritable bowel syndrome   . Headache(784.0)   . 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 IBS (irritable bowel syndrome)   . Bipolar 1 disorder   . CHO intolerance     high cholesterol, bipolar, anxiety disorder, ADHD  . Anxiety   . ADHD (attention deficit hyperactivity disorder)   . Irritable bowel syndrome   . Headache(784.0)   . 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 Hydrocele excision / repair  2005  . Back surgery      Family History: No family history on file.  Social History:  reports that he has been smoking Cigarettes.  He has a 4.5 pack-year smoking history. His smokeless tobacco use includes Snuff and Chew. He reports that he drinks alcohol. He reports that he does not use illicit drugs.  Additional Social History:  Alcohol / Drug Use Pain Medications: N/A Prescriptions: Donitol elixer for IBS, Ibuprophen for back pain Over the Counter: Ibuprophen  History of alcohol / drug use?: Yes Substance #1 Name of Substance 1: ETOH (beer) 1 - Age of First Use: 18 years of age [birth year] - Amount (size/oz): Two to three beers in a month 1 - Frequency: <1x/W 1 - Duration: Ongoing 1 - Last Use / Amount: 08/01 Substance #2 Name of Substance 2: Marijuana 2 - Age of First Use: 18 years of age [birth year] - Amount (size/oz): 1 blunt a week  2 - Frequency: Weekly use 2 - Duration: Last three months 2 - Last Use / Amount: Week before last  CIWA:   COWS:    Allergies:  Allergies  Allergen Reactions  . Calamari Oil Nausea And Vomiting and Swelling  . Other Nausea And Vomiting and Swelling    Calamari (food allergy)  . Klonopin  [Clonazepam] Hives and Rash  . Lamictal [Lamotrigine] Rash    Home Medications:  (Not in a hospital admission)  OB/GYN Status:  No LMP for male patient.  General Assessment Data [facility]: [facility] Is this a Tele or Face-to-Face Assessment?: Face-to-Face Is this an Initial Assessment or a Re-assessment for this encounter?: Initial Assessment Living Arrangements: Parent;Other relatives (Mother, MGM, sister, her husband, nephew & niece) Can pt return to current living arrangement?: Yes Admission Status: Voluntary Is patient capable of signing voluntary admission?: Yes Transfer from: Home Referral Source: Self/Family/Friend  Medical Screening Exam [facility] Walk-in ONLY) Medical Exam completed: No Reason for MSE not completed: Patient Refused  [facility] Crisis Care Plan Living Arrangements: Parent;Other relatives (Mother, MGM, sister, her husband, nephew & niece) Name of Psychiatrist: None Name of Therapist: None  Education Status Is patient currently in school?: No Current Grade: N/A Highest grade of school patient has completed: 10th grade Name of school: N/A Contact person: N/A  Risk to self with the past 6 months Suicidal Ideation: No-Not Currently/Within Last 6 Months Suicidal Intent: No Is patient at risk for suicide?: No Suicidal Plan?: No Access to Means: No What has been your use of drugs/alcohol within the last 12 months?: Some THC & ETOH use Previous Attempts/Gestures: Yes How many times?: 1 Other Self Harm Risks: N/A Triggers for Past Attempts: Other personal contacts Intentional Self Injurious Behavior: None Family Suicide History: No Recent stressful life event(s): Loss (Comment) (Pt's friend hung himself in April.) Persecutory voices/beliefs?: No Depression: Yes Depression Symptoms: Despondent;Insomnia;Loss of interest in usual pleasures Substance abuse history and/or treatment for substance abuse?: No Suicide prevention information  given to non-admitted patients: Not applicable  Risk to Others within the past 6 months Homicidal Ideation: No Thoughts of Harm to Others: No Current Homicidal Intent: No Current Homicidal Plan: No Access to Homicidal Means: No Identified Victim: N/A History of harm to others?: No Assessment of Violence: None Noted Violent Behavior Description: N/A Does patient have access to weapons?: Yes (Comment) (Pt has a knife collection.) Criminal Charges Pending?: No Does patient have a court date: No  Psychosis Hallucinations: None noted Delusions: None noted  Mental Status Report Appear/Hygiene: Unremarkable Eye Contact: Good Motor Activity: Freedom of movement;Unremarkable Speech: Logical/coherent Level of Consciousness: Alert Mood: Depressed;Anxious Affect: Depressed Anxiety Level: Moderate Thought Processes: Coherent;Relevant Judgement: Unimpaired Orientation: Person;Place;Time;Situation Obsessive Compulsive Thoughts/Behaviors: Minimal  Cognitive Functioning Concentration: Decreased Memory: Recent Impaired;Remote Impaired IQ: Average Insight: Fair Impulse Control: Good Appetite: Good Weight Loss: 0 Weight Gain: 0 Sleep: Decreased Total Hours of Sleep:  (5 hours sleep in three days.) Vegetative Symptoms: Staying in bed  ADLScreening [facility] Assessment Services) Patient's cognitive ability adequate to safely  complete daily activities?: Yes Patient able to express need for assistance with ADLs?: Yes Independently performs ADLs?: Yes (appropriate for developmental age)  Prior Inpatient Therapy Prior Inpatient Therapy: Yes Prior Therapy Dates: Jan '15, Mar '14, Jan '12 Prior Therapy Facilty/Provider([facility]): [facility] Reason for Treatment: depression  Prior Outpatient Therapy Prior Outpatient Therapy: Yes Prior Therapy Dates: For 3 months about 2 months ago Prior Therapy Facilty/Provider(s): Dr. Jannifer Franklin Reason for Treatment: med management  ADL Screening (condition at time of  admission) Patient's cognitive ability adequate to safely complete daily activities?: Yes Is the patient deaf or have difficulty hearing?: No Does the patient have difficulty seeing, even when wearing glasses/contacts?: No (Pt does wear glasses.) Does the patient have difficulty concentrating, remembering, or making decisions?: No Patient able to express need for assistance with ADLs?: Yes Does the patient have difficulty dressing or bathing?: No Independently performs ADLs?: Yes (appropriate for developmental age) Does the patient have difficulty walking or climbing stairs?: No Weakness of Legs: None Weakness of Arms/Hands: None  Home Assistive Devices/Equipment Home Assistive Devices/Equipment: None    Abuse/Neglect Assessment (Assessment to be complete while patient is alone) Physical Abuse: Denies Verbal Abuse: Denies Sexual Abuse: Denies Exploitation of patient/patient's resources: Denies Self-Neglect: Denies     Merchant navy officer (For Healthcare) Advance Directive: Patient does not have advance directive;Patient would not like information Pre-existing out of facility DNR order (yellow form or pink MOST form): No    Additional Information 1:1 In Past 12 Months?: No CIRT Risk: No Elopement Risk: No Does patient have medical clearance?: No (MSE refusal)     Disposition:  Disposition Initial Assessment Completed for this Encounter: Yes Disposition of Patient: Outpatient treatment;Referred to Type of outpatient treatment: Adult Patient referred to:  (Pt given outpatient referral information.)  On Site Evaluation by:   Reviewed with Physician:    Alexandria Lodge 03/21/2014 8:12 AM

## 2015-02-01 ENCOUNTER — Encounter (HOSPITAL_COMMUNITY): Payer: Self-pay | Admitting: Emergency Medicine

## 2015-02-01 ENCOUNTER — Emergency Department (HOSPITAL_COMMUNITY): Payer: Medicaid Other

## 2015-02-01 ENCOUNTER — Emergency Department (HOSPITAL_COMMUNITY)
Admission: EM | Admit: 2015-02-01 | Discharge: 2015-02-01 | Disposition: A | Discharge status: Home / Self Care | Payer: Medicaid Other | Attending: Emergency Medicine | Admitting: Emergency Medicine

## 2015-02-01 DIAGNOSIS — R1110 Vomiting, unspecified: Secondary | ICD-10-CM | POA: Insufficient documentation

## 2015-02-01 DIAGNOSIS — F319 Bipolar disorder, unspecified: Secondary | ICD-10-CM | POA: Insufficient documentation

## 2015-02-01 DIAGNOSIS — R05 Cough: Secondary | ICD-10-CM

## 2015-02-01 DIAGNOSIS — Z8639 Personal history of other endocrine, nutritional and metabolic disease: Secondary | ICD-10-CM | POA: Insufficient documentation

## 2015-02-01 DIAGNOSIS — R21 Rash and other nonspecific skin eruption: Secondary | ICD-10-CM | POA: Insufficient documentation

## 2015-02-01 DIAGNOSIS — Z720 Tobacco use: Secondary | ICD-10-CM | POA: Insufficient documentation

## 2015-02-01 DIAGNOSIS — Z8719 Personal history of other diseases of the digestive system: Secondary | ICD-10-CM | POA: Insufficient documentation

## 2015-02-01 DIAGNOSIS — F419 Anxiety disorder, unspecified: Secondary | ICD-10-CM | POA: Insufficient documentation

## 2015-02-01 DIAGNOSIS — Z79899 Other long term (current) drug therapy: Secondary | ICD-10-CM | POA: Insufficient documentation

## 2015-02-01 DIAGNOSIS — R059 Cough, unspecified: Secondary | ICD-10-CM

## 2015-02-01 DIAGNOSIS — Z87828 Personal history of other (healed) physical injury and trauma: Secondary | ICD-10-CM | POA: Insufficient documentation

## 2015-02-01 MED ORDER — BENZONATATE 100 MG PO CAPS
200.0000 mg | ORAL_CAPSULE | Freq: Once | ORAL | Status: AC
  Administered 2015-02-01: 200 mg via ORAL
  Filled 2015-02-01: qty 2

## 2015-02-01 MED ORDER — BENZONATATE 100 MG PO CAPS: 100.0000 mg | ORAL_CAPSULE | Freq: Three times a day (TID) | ORAL | Status: DC | PRN

## 2015-02-01 NOTE — ED Notes (Signed)
Pt. reports persistent productive cough with chest congestion for 3 weeks and emesis this evening .

## 2015-02-01 NOTE — Discharge Instructions (Signed)
Cough, Adult [name], your chest xray is negative for pneumonia.  Take cough medicine as prescribed to help your symptoms.  Use over the counter hydrocortisone cream for your rash.  See a primary doctor within 3 days for close follow up.  If symptoms worsen, come back to the ED immediately.  Thank you.  A cough is a reflex. It helps you clear your throat and airways. A cough can help heal your body. A cough can last 2 or 3 weeks (acute) or may last more than 8 weeks (chronic). Some common causes of a cough can include an infection, allergy, or a cold. HOME CARE  Only take medicine as told by your doctor.  If given, take your medicines (antibiotics) as told. Finish them even if you start to feel better.  Use a cold steam vaporizer or humidifier in your home. This can help loosen thick spit (secretions).  Sleep so you are almost sitting up (semi-upright). Use pillows to do this. This helps reduce coughing.  Rest as needed.  Stop smoking if you smoke. GET HELP RIGHT AWAY IF:  You have yellowish-white fluid (pus) in your thick spit.  Your cough gets worse.  Your medicine does not reduce coughing, and you are losing sleep.  You cough up blood.  You have trouble breathing.  Your pain gets worse and medicine does not help.  You have a fever. MAKE SURE YOU:   Understand these instructions.  Will watch your condition.  Will get help right away if you are not doing well or get worse. Document Released: 04/09/2011 Document Revised: 12/11/2013 Document Reviewed: 04/09/2011 [facility] Patient Information 2015 [place], [place]. This information is not intended to replace advice given to you by your health care provider. Make sure you discuss any questions you have with your health care provider. Rash A rash is a change in the color or feel of your skin. There are many different types of rashes. You may have other problems along with your rash. HOME CARE  Avoid the thing that caused  your rash.  Do not scratch your rash.  You may take cools baths to help stop itching.  Only take medicines as told by your doctor.  Keep all doctor visits as told. GET HELP RIGHT AWAY IF:   Your pain, puffiness (swelling), or redness gets worse.  You have a fever.  You have new or severe problems.  You have body aches, watery poop (diarrhea), or you throw up (vomit).  Your rash is not better after 3 days. MAKE SURE YOU:   Understand these instructions.  Will watch your condition.  Will get help right away if you are not doing well or get worse. Document Released: 01/13/2008 Document Revised: 10/19/2011 Document Reviewed: 05/11/2011 [facility] Patient Information 2015 [place], [place]. This information is not intended to replace advice given to you by your health care provider. Make sure you discuss any questions you have with your health care provider.

## 2015-02-01 NOTE — ED Notes (Signed)
Pt stable, ambulatory, states understanding of discharge instructions 

## 2015-02-01 NOTE — ED Provider Notes (Signed)
CSN: [id number]     Arrival date & time 02/01/15  0026 History   First MD Initiated Contact with Patient 02/01/15 0056     Chief Complaint  Patient presents with  . Cough  . Emesis     (Consider location/radiation/quality/duration/timing/severity/associated sxs/prior Treatment) HPI  [name] is a 19yo male with no significant past medical history presenting today with 3 weeks worth of cough. He states it is productive for the first 2 weeks and now is a dry cough. He denies fevers. He's had sick contacts and his nephew with similar complaints. Patient also complains of a rash on his bilateral hands and bilateral knees for the past 2 months. He has not tried anything to make the symptoms better or worse. He has had no fevers or chills. He has no further complaints.  10 Systems reviewed and are negative for acute change except as noted in the HPI.    Past Medical History  Diagnosis Date  . IBS (irritable bowel syndrome)   . Bipolar 1 disorder   . CHO intolerance     high cholesterol, bipolar, anxiety disorder, ADHD  . Anxiety   . ADHD (attention deficit hyperactivity disorder)   . Irritable bowel syndrome   . Headache(784.0)   . 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 Hydrocele excision / repair  2005  . Back surgery     No family history on file. History  Substance Use Topics  . Smoking status: Current Every Day Smoker -- 1.50 packs/day for 3 years    Types: Cigarettes  . Smokeless tobacco: Current User    Types: Snuff, Chew  . Alcohol Use: Yes     Comment: Few beers per month    Review of Systems    Allergies  Calamari oil; Other; Klonopin; and Lamictal  Home Medications   Prior to Admission medications   Medication Sig Start Date End Date Taking? Authorizing Provider  acetaminophen (TYLENOL) 500 MG tablet Take 3,000 mg by mouth once.    Historical Provider, MD  bacitracin ointment Apply 1  application topically 2 (two) times daily. 02/25/14   Brandt Loosen, MD  CAFFEINE PO Take 1 tablet by mouth once.    Historical Provider, MD  gabapentin (NEURONTIN) 600 MG tablet Take 600 mg by mouth daily as needed (for pain).    Historical Provider, MD  hydrOXYzine (ATARAX/VISTARIL) 25 MG tablet Take 1 tablet (25 mg total) by mouth every 6 (six) hours. 02/26/14   Brandt Loosen, MD  PB-Hyoscy-Atropine-Scopolamine (DONNATAL PO) Take 1 Bottle by mouth every 6 (six) hours as needed (for stoamch pain. Samples from MD.).    Historical Provider, MD  promethazine (PHENERGAN) 25 MG tablet Take 25 mg by mouth every 8 (eight) hours as needed for nausea or vomiting.    Historical Provider, MD  traMADol (ULTRAM) 50 MG tablet Take 1 tablet (50 mg total) by mouth every 6 (six) hours as needed. 03/10/14   Tatyana Kirichenko, PA-C   BP 130/71 mmHg  Pulse 85  Temp(Src) 97.8 F (36.6 C) (Oral)  Resp 18  Ht 6' (1.829 m)  Wt 214 lb 12.8 oz (97.433 kg)  BMI 29.13 kg/m2  SpO2 99% Physical Exam  Constitutional: He is oriented to person, place, and time. Vital signs are normal. He appears well-developed and well-nourished.  Non-toxic appearance. He does not appear ill. No distress.  HENT:  Head: Normocephalic and atraumatic.  Nose: Nose normal.  Mouth/Throat: Oropharynx is clear and moist. No oropharyngeal exudate.  Eyes: Conjunctivae and EOM are normal. Pupils are equal, round, and reactive to light. No scleral icterus.  Neck: Normal range of motion. Neck supple. No tracheal deviation, no edema, no erythema and normal range of motion present. No thyroid mass and no thyromegaly present.  Cardiovascular: Normal rate, regular rhythm, S1 normal, S2 normal, normal heart sounds, intact distal pulses and normal pulses.  Exam reveals no gallop and no friction rub.   No murmur heard. Pulses:      Radial pulses are 2+ on the right side, and 2+ on the left side.       Dorsalis pedis pulses are 2+ on the right side, and 2+ on  the left side.  Pulmonary/Chest: Effort normal and breath sounds normal. No respiratory distress. He has no wheezes. He has no rhonchi. He has no rales.  Abdominal: Soft. Normal appearance and bowel sounds are normal. He exhibits no distension, no ascites and no mass. There is no hepatosplenomegaly. There is no tenderness. There is no rebound, no guarding and no CVA tenderness.  Musculoskeletal: Normal range of motion. He exhibits no edema or tenderness.  Lymphadenopathy:    He has no cervical adenopathy.  Neurological: He is alert and oriented to person, place, and time. He has normal strength. No cranial nerve deficit or sensory deficit.  Skin: Skin is warm, dry and intact. Rash noted. No petechiae noted. He is not diaphoretic. No erythema. No pallor.  Psychiatric: He has a normal mood and affect. His behavior is normal. Judgment normal.  Nursing note and vitals reviewed.   ED Course  Procedures (including critical care time) Labs Review Labs Reviewed - No data to display  Imaging Review Dg Chest 2 View  02/01/2015   CLINICAL DATA:  Subacute onset of productive cough for 2-3 weeks. Initial encounter.  EXAM: CHEST  2 VIEW  COMPARISON:  Chest radiograph from 02/24/2012  FINDINGS: The lungs are well-aerated and clear. There is no evidence of focal opacification, pleural effusion or pneumothorax.  The heart is normal in size; the mediastinal contour is within normal limits. No acute osseous abnormalities are seen.  IMPRESSION: No acute cardiopulmonary process seen.   Electronically Signed   By: Roanna Raider M.D.   On: 02/01/2015 02:02     EKG Interpretation None      MDM   Final diagnoses:  Cough    Patient presents to the ED for 3 weeks of cough. Chest x-ray is negative for pneumonia. This is likely due to viral infection. He was given [facility] emergency department and will be discharged home with a prescription. Primary care follow-up was advised. Regarding the patient's  rash, he was told to use Benadryl at home as needed for itching. He can also try hydrocortisone cream over-the-counter. He otherwise appears well and in NAD.  His VS remain within his normal limits and he is safe for DC.    Tomasita Crumble, MD 02/01/15 [phone]

## 2016-01-01 ENCOUNTER — Encounter (HOSPITAL_COMMUNITY): Payer: Self-pay | Admitting: Emergency Medicine

## 2016-01-01 DIAGNOSIS — F17210 Nicotine dependence, cigarettes, uncomplicated: Secondary | ICD-10-CM | POA: Insufficient documentation

## 2016-01-01 DIAGNOSIS — Z8639 Personal history of other endocrine, nutritional and metabolic disease: Secondary | ICD-10-CM | POA: Insufficient documentation

## 2016-01-01 DIAGNOSIS — F319 Bipolar disorder, unspecified: Secondary | ICD-10-CM | POA: Insufficient documentation

## 2016-01-01 DIAGNOSIS — Z79899 Other long term (current) drug therapy: Secondary | ICD-10-CM | POA: Insufficient documentation

## 2016-01-01 DIAGNOSIS — F419 Anxiety disorder, unspecified: Secondary | ICD-10-CM | POA: Insufficient documentation

## 2016-01-01 DIAGNOSIS — K589 Irritable bowel syndrome without diarrhea: Secondary | ICD-10-CM | POA: Insufficient documentation

## 2016-01-01 DIAGNOSIS — J159 Unspecified bacterial pneumonia: Secondary | ICD-10-CM | POA: Insufficient documentation

## 2016-01-01 DIAGNOSIS — Z87828 Personal history of other (healed) physical injury and trauma: Secondary | ICD-10-CM | POA: Insufficient documentation

## 2016-01-01 NOTE — ED Notes (Signed)
Pt. reports persistent productive cough with chest congestion for several weeks , respirations unlabored , denies fever .

## 2016-01-02 ENCOUNTER — Emergency Department (HOSPITAL_COMMUNITY)
Admission: EM | Admit: 2016-01-02 | Discharge: 2016-01-02 | Disposition: A | Discharge status: Home / Self Care | Payer: Medicaid Other | Attending: Emergency Medicine | Admitting: Emergency Medicine

## 2016-01-02 ENCOUNTER — Emergency Department (HOSPITAL_COMMUNITY): Payer: Medicaid Other

## 2016-01-02 DIAGNOSIS — R0989 Other specified symptoms and signs involving the circulatory and respiratory systems: Secondary | ICD-10-CM

## 2016-01-02 DIAGNOSIS — R05 Cough: Secondary | ICD-10-CM

## 2016-01-02 DIAGNOSIS — R058 Other specified cough: Secondary | ICD-10-CM

## 2016-01-02 DIAGNOSIS — J189 Pneumonia, unspecified organism: Secondary | ICD-10-CM

## 2016-01-02 MED ORDER — BENZONATATE 100 MG PO CAPS
200.0000 mg | ORAL_CAPSULE | Freq: Once | ORAL | Status: AC
  Administered 2016-01-02: 200 mg via ORAL
  Filled 2016-01-02: qty 2

## 2016-01-02 MED ORDER — DEXTROSE 5 % IV SOLN
1.0000 g | Freq: Once | INTRAVENOUS | Status: AC
  Administered 2016-01-02: 1 g via INTRAVENOUS
  Filled 2016-01-02: qty 10

## 2016-01-02 MED ORDER — BENZONATATE 100 MG PO CAPS: 100.0000 mg | ORAL_CAPSULE | Freq: Three times a day (TID) | ORAL | Status: AC

## 2016-01-02 MED ORDER — AZITHROMYCIN 250 MG PO TABS: 250.0000 mg | ORAL_TABLET | Freq: Every day | ORAL | Status: AC

## 2016-01-02 MED ORDER — ALBUTEROL SULFATE HFA 108 (90 BASE) MCG/ACT IN AERS
2.0000 | INHALATION_SPRAY | RESPIRATORY_TRACT | Status: DC | PRN
  Administered 2016-01-02: 2 via RESPIRATORY_TRACT
  Filled 2016-01-02: qty 6.7

## 2016-01-02 MED ORDER — AZITHROMYCIN 250 MG PO TABS
500.0000 mg | ORAL_TABLET | Freq: Once | ORAL | Status: AC
  Administered 2016-01-02: 500 mg via ORAL
  Filled 2016-01-02: qty 2

## 2016-01-02 NOTE — ED Provider Notes (Signed)
CSN: [id number]     Arrival date & time 01/01/16  2338 History   First MD Initiated Contact with Patient 01/02/16 0016     Chief Complaint  Patient presents with  . Cough     (Consider location/radiation/quality/duration/timing/severity/associated sxs/prior Treatment) The history is provided by the patient and a parent.   Dennis A Cashel Bellina. is a 20 y.o. male  Presenting with a 2 week history which started as uri type symptoms including nasal congestion, clear rhinorhea, cough which initially was productive of yellow sputum but over the past week has developed chest congestion, a less productive cough and shortness of breath with exertion along with subjective fevers and generalized fatigue.  He has used several different mucinex products without relief of symptoms. He denies chest pain and wheezing but endorses his chest feels tight.    Past Medical History  Diagnosis Date  . IBS (irritable bowel syndrome)   . Bipolar 1 disorder (HCC)   . CHO intolerance     high cholesterol, bipolar, anxiety disorder, ADHD  . Anxiety   . ADHD (attention deficit hyperactivity disorder)   . Irritable bowel syndrome   . Headache(784.0)   . Lumbar cord injury without spinal bone injury [facility])    Past Surgical History  Procedure Laterality Date  . Hydrocele excision / repair  2005  . Back surgery     No family history on file. Social History  Substance Use Topics  . Smoking status: Current Every Day Smoker -- 1.50 packs/day for 3 years    Types: Cigarettes  . Smokeless tobacco: Current User    Types: Snuff, Chew  . Alcohol Use: Yes    Review of Systems  Constitutional: Positive for fever, chills and fatigue.  HENT: Positive for congestion, rhinorrhea and sore throat. Negative for ear pain, sinus pressure, trouble swallowing and voice change.   Eyes: Negative for discharge.  Respiratory: Positive for cough, chest tightness and shortness of breath. Negative for wheezing and stridor.    Cardiovascular: Negative for chest pain.  Gastrointestinal: Negative for abdominal pain.  Genitourinary: Negative.       Allergies  Calamari oil; Other; Klonopin; and Lamictal  Home Medications   Prior to Admission medications   Medication Sig Start Date End Date Taking? Authorizing Provider  acetaminophen (TYLENOL) 500 MG tablet Take 3,000 mg by mouth once.    Historical Provider, MD  azithromycin (ZITHROMAX Z-PAK) 250 MG tablet Take 1 tablet (250 mg total) by mouth daily. 01/02/16   Burgess Amor, PA-C  bacitracin ointment Apply 1 application topically 2 (two) times daily. 02/25/14   Brandt Loosen, MD  benzonatate (TESSALON) 100 MG capsule Take 1 capsule (100 mg total) by mouth every 8 (eight) hours. 01/02/16   Burgess Amor, PA-C  CAFFEINE PO Take 1 tablet by mouth once.    Historical Provider, MD  gabapentin (NEURONTIN) 600 MG tablet Take 600 mg by mouth daily as needed (for pain).    Historical Provider, MD  hydrOXYzine (ATARAX/VISTARIL) 25 MG tablet Take 1 tablet (25 mg total) by mouth every 6 (six) hours. 02/26/14   Brandt Loosen, MD  PB-Hyoscy-Atropine-Scopolamine (DONNATAL PO) Take 1 Bottle by mouth every 6 (six) hours as needed (for stoamch pain. Samples from MD.).    Historical Provider, MD  promethazine (PHENERGAN) 25 MG tablet Take 25 mg by mouth every 8 (eight) hours as needed for nausea or vomiting.    Historical Provider, MD  traMADol (ULTRAM) 50 MG tablet Take 1 tablet (50 mg total) by  mouth every 6 (six) hours as needed. 03/10/14   Tatyana Kirichenko, PA-C   BP 112/69 mmHg  Pulse 88  Temp(Src) 98.4 F (36.9 C) (Oral)  Resp 18  SpO2 93% Physical Exam  Constitutional: He is oriented to person, place, and time. He appears well-developed and well-nourished.  HENT:  Head: Normocephalic and atraumatic.  Right Ear: Tympanic membrane and ear canal normal.  Left Ear: Tympanic membrane and ear canal normal.  Nose: Mucosal edema and rhinorrhea present.  Mouth/Throat: Uvula is midline,  oropharynx is clear and moist and mucous membranes are normal. No oropharyngeal exudate, posterior oropharyngeal edema, posterior oropharyngeal erythema or tonsillar abscesses.  Eyes: Conjunctivae are normal.  Cardiovascular: Normal rate and normal heart sounds.   Pulmonary/Chest: Effort normal. No respiratory distress. He has decreased breath sounds in the right middle field and the right lower field. He has no wheezes. He has no rhonchi. He has no rales. He exhibits no tenderness.  Coarse breath sounds throughout.  Abdominal: Soft. There is no tenderness.  Musculoskeletal: Normal range of motion.  Neurological: He is alert and oriented to person, place, and time.  Skin: Skin is warm and dry. No rash noted.  Psychiatric: He has a normal mood and affect.    ED Course  Procedures (including critical care time) Labs Review Labs Reviewed - No data to display  Imaging Review Dg Chest 2 View  01/02/2016  CLINICAL DATA:  Productive cough. Chest congestion. Shortness of breath. EXAM: CHEST  2 VIEW COMPARISON:  Radiographs 02/01/2015 FINDINGS: Patchy lingular opacity consistent with pneumonia. Minimal patchy opacity in the right middle lobe. Heart size and mediastinal contours are normal. No pleural effusion or pneumothorax. No acute osseous abnormality. IMPRESSION: Patchy lingular and right middle lobe consolidations consistent with multifocal pneumonia. Electronically Signed   By: Rubye OaksMelanie  Ehinger M.D.   On: 01/02/2016 00:17   I have personally reviewed and evaluated these images and lab results as part of my medical decision-making.   EKG Interpretation None      MDM   Final diagnoses:  Community acquired pneumonia     Radiological studies were viewed, interpreted and considered during the medical decision making and disposition process. I agree with radiologists reading.  Results were also discussed with patient and mother. He was given rocephin IV and started on z pack while here. Also  prescribed tessalon and given albuterol mdi for q 4 hour use. He was encouraged rest, discussed his need for smoking cessation.  Advised recheck here for any worsened sx such as increased weakness, sob or other concerns. Plan f/u with pcp in one week, sooner if sx are not improving with todays tx.   Pt ambulated in dept with no increased sob or desaturation.     Burgess AmorJulie Sharnise Blough, PA-C 01/02/16 1306  Loren Raceravid Yelverton, MD 01/03/16 [phone]

## 2016-01-02 NOTE — Discharge Instructions (Signed)
Community-Acquired Pneumonia, Adult °Pneumonia is an infection of the lungs. There are different types of pneumonia. One type can develop while a person is in a hospital. A different type, called community-acquired pneumonia, develops in people who are not, or have not recently been, in the hospital or other health care facility.  °CAUSES °Pneumonia may be caused by bacteria, viruses, or funguses. Community-acquired pneumonia is often caused by Streptococcus pneumonia bacteria. These bacteria are often passed from one person to another by breathing in droplets from the cough or sneeze of an infected person. °RISK FACTORS °The condition is more likely to develop in: °· People who have chronic diseases, such as chronic obstructive pulmonary disease (COPD), asthma, congestive heart failure, cystic fibrosis, diabetes, or kidney disease. °· People who have early-stage or late-stage HIV. °· People who have sickle cell disease. °· People who have had their spleen removed (splenectomy). °· People who have poor dental hygiene. °· People who have medical conditions that increase the risk of breathing in (aspirating) secretions their own mouth and nose.   °· People who have a weakened immune system (immunocompromised). °· People who smoke. °· People who travel to areas where pneumonia-causing germs commonly exist. °· People who are around animal habitats or animals that have pneumonia-causing germs, including birds, bats, rabbits, cats, and farm animals. °SYMPTOMS °Symptoms of this condition include: °· A dry cough. °· A wet (productive) cough. °· Fever. °· Sweating. °· Chest pain, especially when breathing deeply or coughing. °· Rapid breathing or difficulty breathing. °· Shortness of breath. °· Shaking chills. °· Fatigue. °· Muscle aches. °DIAGNOSIS °Your health care provider will take a medical history and perform a physical exam. You may also have other tests, including: °· Imaging studies of your chest, including  X-rays. °· Tests to check your blood oxygen level and other blood gases. °· Other tests on blood, mucus (sputum), fluid around your lungs (pleural fluid), and urine. °If your pneumonia is severe, other tests may be done to identify the specific cause of your illness. °TREATMENT °The type of treatment that you receive depends on many factors, such as the cause of your pneumonia, the medicines you take, and other medical conditions that you have. For most adults, treatment and recovery from pneumonia may occur at home. In some cases, treatment must happen in a hospital. Treatment may include: °· Antibiotic medicines, if the pneumonia was caused by bacteria. °· Antiviral medicines, if the pneumonia was caused by a virus. °· Medicines that are given by mouth or through an IV tube. °· Oxygen. °· Respiratory therapy. °Although rare, treating severe pneumonia may include: °· Mechanical ventilation. This is done if you are not breathing well on your own and you cannot maintain a safe blood oxygen level. °· Thoracentesis. This procedure removes fluid around one lung or both lungs to help you breathe better. °HOME CARE INSTRUCTIONS °· Take over-the-counter and prescription medicines only as told by your health care provider. °¨ Only take cough medicine if you are losing sleep. Understand that cough medicine can prevent your body's natural ability to remove mucus from your lungs. °¨ If you were prescribed an antibiotic medicine, take it as told by your health care provider. Do not stop taking the antibiotic even if you start to feel better. °· Sleep in a semi-upright position at night. Try sleeping in a reclining chair, or place a few pillows under your head. °· Do not use tobacco products, including cigarettes, chewing tobacco, and e-cigarettes. If you need help quitting, ask your health care provider. °· Drink enough water to keep your urine   clear or pale yellow. This will help to thin out mucus secretions in your  lungs. PREVENTION There are ways that you can decrease your risk of developing community-acquired pneumonia. Consider getting a pneumococcal vaccine if:  You are older than 22 years of age.  You are older than 22 years of age and are undergoing cancer treatment, have chronic lung disease, or have other medical conditions that affect your immune system. Ask your health care provider if this applies to you. There are different types and schedules of pneumococcal vaccines. Ask your health care provider which vaccination option is best for you. You may also prevent community-acquired pneumonia if you take these actions:  Get an influenza vaccine every year. Ask your health care provider which type of influenza vaccine is best for you.  Go to the dentist on a regular basis.  Wash your hands often. Use hand sanitizer if soap and water are not available. SEEK MEDICAL CARE IF:  You have a fever.  You are losing sleep because you cannot control your cough with cough medicine. SEEK IMMEDIATE MEDICAL CARE IF:  You have worsening shortness of breath.  You have increased chest pain.  Your sickness becomes worse, especially if you are an older adult or have a weakened immune system.  You cough up blood.   This information is not intended to replace advice given to you by your health care provider. Make sure you discuss any questions you have with your health care provider.   Document Released: 07/27/2005 Document Revised: 04/17/2015 Document Reviewed: 11/21/2014 Elsevier Interactive Patient Education 2016 [employer]   Take your next dose of Zithromax tomorrow evening.  Usually your inhaler if needed for coughing or wheezing, you may take 2 puffs every 4 hours.  Tessalon may help you with the frequency of your cough.  Make sure you're drinking plenty of fluids and rest.  You need to consider smoking cessation as this probably the primary reason for you having this infection today.  Return  here for any worsening symptoms.  Follow-up with your doctor within 1 week for recheck.

## 2016-01-02 NOTE — ED Notes (Signed)
Pt to room TR03 via wheelchair by xray tech

## 2016-01-02 NOTE — ED Notes (Signed)
Pt in X-Ray ?

## 2016-06-09 ENCOUNTER — Encounter (HOSPITAL_COMMUNITY): Payer: Self-pay

## 2016-06-09 ENCOUNTER — Emergency Department (HOSPITAL_COMMUNITY)
Admission: EM | Admit: 2016-06-09 | Discharge: 2016-06-10 | Disposition: A | Discharge status: Home / Self Care | Payer: Self-pay | Attending: Emergency Medicine | Admitting: Emergency Medicine

## 2016-06-09 DIAGNOSIS — Z79899 Other long term (current) drug therapy: Secondary | ICD-10-CM | POA: Insufficient documentation

## 2016-06-09 DIAGNOSIS — F909 Attention-deficit hyperactivity disorder, unspecified type: Secondary | ICD-10-CM | POA: Insufficient documentation

## 2016-06-09 DIAGNOSIS — F17210 Nicotine dependence, cigarettes, uncomplicated: Secondary | ICD-10-CM | POA: Insufficient documentation

## 2016-06-09 DIAGNOSIS — J0110 Acute frontal sinusitis, unspecified: Secondary | ICD-10-CM | POA: Insufficient documentation

## 2016-06-09 MED ORDER — BENZONATATE 100 MG PO CAPS: 100.0000 mg | ORAL_CAPSULE | Freq: Three times a day (TID) | ORAL | 0 refills | Status: AC

## 2016-06-09 MED ORDER — AMOXICILLIN-POT CLAVULANATE 875-125 MG PO TABS: 1.0000 | ORAL_TABLET | Freq: Two times a day (BID) | ORAL | 0 refills | Status: AC

## 2016-06-09 NOTE — ED Triage Notes (Signed)
Pt states that over the past few weeks he has been experiencing sinus pain and pressure. Also c/o productive cough and intermittent nausea. A&Ox4.

## 2016-06-09 NOTE — Discharge Instructions (Signed)
Your sinus symptoms have been going on long enough that they may be caused by a bacterial infection. Drink plenty of fluids and get plenty of rest. You should be drinking at least a one half to one liter of water an hour to stay hydrated. Ibuprofen, Naproxen, or Tylenol for pain or fever. Tessalon for cough. Plain Mucinex may help relieve congestion. Warm liquids or Chloraseptic spray may help soothe a sore throat.  Please take all of your antibiotics until finished!   You may develop abdominal discomfort or diarrhea from the antibiotic.  You may help offset this with probiotics which you can buy or get in yogurt. Do not eat or take the probiotics until 2 hours after your antibiotic.

## 2016-06-09 NOTE — ED Provider Notes (Signed)
[facility]-EMERGENCY DEPT Provider Note   CSN: [id number] Arrival date & time: 06/09/16  2258     History   Chief Complaint Chief Complaint  Patient presents with  . Cough  . Headache    HPI Dennis BallerMark A Kapler Jr. is a 20 y.o. male.  HPI   Dennis BallerMark A Galindez Jr. is a 20 y.o. male, with a history of Bipolar, presenting to the ED with a sinus headache from sinus pressure and drainage for the last three weeks. Productive cough with green sputum for the last week. Patient has not taken any medications for his symptoms. Denies fever/chills, shortness of breath, chest pain, or any other complaints.    Past Medical History:  Diagnosis Date  . ADHD (attention deficit hyperactivity disorder)   . Anxiety   . Bipolar 1 disorder (HCC)   . CHO intolerance    high cholesterol, bipolar, anxiety disorder, ADHD  . Headache(784.0)   . IBS (irritable bowel syndrome)   . Irritable bowel syndrome   . 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 Bipolar 1 disorder, depressed (HCC) 08/14/2013  . Major depressive disorder, recurrent episode, moderate (HCC) 11/09/2012    Past Surgical History:  Procedure Laterality Date  . BACK SURGERY    . HYDROCELE EXCISION / REPAIR  2005       Home Medications    Prior to Admission medications   Medication Sig Start Date End Date Taking? Authorizing Provider  acetaminophen (TYLENOL) 500 MG tablet Take 3,000 mg by mouth once.    Historical Provider, MD  amoxicillin-clavulanate (AUGMENTIN) 875-125 MG tablet Take 1 tablet by mouth every 12 (twelve) hours. 06/09/16   Zayveon Raschke C Katiya Fike, PA-C  azithromycin (ZITHROMAX Z-PAK) 250 MG tablet Take 1 tablet (250 mg total) by mouth daily. 01/02/16   Burgess AmorJulie Idol, PA-C  bacitracin ointment Apply 1 application topically 2 (two) times daily. 02/25/14   Brandt LoosenJulie Manly, MD  benzonatate (TESSALON) 100 MG capsule Take 1 capsule (100 mg total) by mouth every 8 (eight)  hours. 01/02/16   Burgess AmorJulie Idol, PA-C  benzonatate (TESSALON) 100 MG capsule Take 1 capsule (100 mg total) by mouth every 8 (eight) hours. 06/09/16   Dannah Ryles C Klair Leising, PA-C  CAFFEINE PO Take 1 tablet by mouth once.    Historical Provider, MD  gabapentin (NEURONTIN) 600 MG tablet Take 600 mg by mouth daily as needed (for pain).    Historical Provider, MD  hydrOXYzine (ATARAX/VISTARIL) 25 MG tablet Take 1 tablet (25 mg total) by mouth every 6 (six) hours. 02/26/14   Brandt LoosenJulie Manly, MD  PB-Hyoscy-Atropine-Scopolamine (DONNATAL PO) Take 1 Bottle by mouth every 6 (six) hours as needed (for stoamch pain. Samples from MD.).    Historical Provider, MD  promethazine (PHENERGAN) 25 MG tablet Take 25 mg by mouth every 8 (eight) hours as needed for nausea or vomiting.    Historical Provider, MD  traMADol (ULTRAM) 50 MG tablet Take 1 tablet (50 mg total) by mouth every 6 (six) hours as needed. 03/10/14   Jaynie Crumbleatyana Kirichenko, PA-C    Family History History reviewed. No pertinent family history.  Social History Social History  Substance Use Topics  . Smoking status: Current Every Day Smoker    Packs/day: 1.50    Years: 3.00    Types: Cigarettes  . Smokeless tobacco: Current User    Types: Snuff, Chew  . Alcohol use Yes     Allergies  Calamari oil; Other; Klonopin [clonazepam]; and Lamictal [lamotrigine]   Review of Systems Review of Systems  Constitutional: Negative for chills, diaphoresis and fever.  HENT: Positive for congestion, postnasal drip and sinus pressure. Negative for trouble swallowing and voice change.   Respiratory: Positive for cough. Negative for shortness of breath.   Gastrointestinal: Positive for nausea.  All other systems reviewed and are negative.    Physical Exam Updated Vital Signs BP 113/71 (BP Location: Right Arm)   Pulse 86   Temp 98.3 F (36.8 C) (Oral)   Resp 18   Ht 5\' 11"  (1.803 m)   SpO2 98%   Physical Exam  Constitutional: He appears well-developed and  well-nourished. No distress.  HENT:  Head: Normocephalic and atraumatic.  Frontal sinus tenderness without swelling bilaterally.  Eyes: Conjunctivae are normal.  Neck: Neck supple.  Cardiovascular: Normal rate, regular rhythm, normal heart sounds and intact distal pulses.   Pulmonary/Chest: Effort normal and breath sounds normal. No respiratory distress.  Abdominal: Soft. There is no tenderness. There is no guarding.  Musculoskeletal: He exhibits no edema.  Lymphadenopathy:    He has no cervical adenopathy.  Neurological: He is alert.  Skin: Skin is warm and dry. He is not diaphoretic.  Psychiatric: He has a normal mood and affect. His behavior is normal.  Nursing note and vitals reviewed.    ED Treatments / Results  Labs (all labs ordered are listed, but only abnormal results are displayed) Labs Reviewed  RAPID STREP SCREEN (NOT [facility])  CULTURE, GROUP A STREP [facility])    EKG  EKG Interpretation None       Radiology No results found.  Procedures Procedures (including critical care time)  Medications Ordered in ED Medications - No data to display   Initial Impression / Assessment and Plan / ED Course  I have reviewed the triage vital signs and the nursing notes.  Pertinent labs & imaging results that were available during my care of the patient were reviewed by me and considered in my medical decision making (see chart for details).  Clinical Course    Sinusitis. Antibiotics due to duration of symptoms. Chest x-ray not obtained since prescribed antibiotic will cover for pneumonia simultaneously. PCP follow-up. The patient was given instructions for home care as well as return precautions. Patient voices understanding of these instructions, accepts the plan, and is comfortable with discharge.  Vitals:   06/09/16 2317 06/09/16 2318  BP: 113/71   Pulse: 86   Resp: 18   Temp: 98.3 F (36.8 C)   TempSrc: Oral   SpO2: 98%   Height:  5\' 11"  (1.803 m)      Final Clinical Impressions(s) / ED Diagnoses   Final diagnoses:  Acute non-recurrent frontal sinusitis    New Prescriptions Discharge Medication List as of 06/09/2016 11:52 PM    START taking these medications   Details  amoxicillin-clavulanate (AUGMENTIN) 875-125 MG tablet Take 1 tablet by mouth every 12 (twelve) hours., Starting Tue 06/09/2016, Print    !! benzonatate (TESSALON) 100 MG capsule Take 1 capsule (100 mg total) by mouth every 8 (eight) hours., Starting Tue 06/09/2016, Print     !! - Potential duplicate medications found. Please discuss with provider.       Anselm PancoastShawn C Lunden Mcleish, PA-C 06/10/16 0135    Shaune Pollackameron Isaacs, MD 06/10/16 2044

## 2016-06-10 LAB — RAPID STREP SCREEN (MED CTR MEBANE ONLY): STREPTOCOCCUS, GROUP A SCREEN (DIRECT): NEGATIVE

## 2016-06-12 LAB — CULTURE, GROUP A STREP (THRC)

## 2016-08-04 ENCOUNTER — Emergency Department (HOSPITAL_COMMUNITY): Payer: Self-pay

## 2016-08-04 ENCOUNTER — Encounter (HOSPITAL_COMMUNITY): Payer: Self-pay | Admitting: *Deleted

## 2016-08-04 DIAGNOSIS — J111 Influenza due to unidentified influenza virus with other respiratory manifestations: Secondary | ICD-10-CM | POA: Insufficient documentation

## 2016-08-04 DIAGNOSIS — F909 Attention-deficit hyperactivity disorder, unspecified type: Secondary | ICD-10-CM | POA: Insufficient documentation

## 2016-08-04 DIAGNOSIS — Z79899 Other long term (current) drug therapy: Secondary | ICD-10-CM | POA: Insufficient documentation

## 2016-08-04 DIAGNOSIS — F17210 Nicotine dependence, cigarettes, uncomplicated: Secondary | ICD-10-CM | POA: Insufficient documentation

## 2016-08-04 LAB — CBC WITH DIFFERENTIAL/PLATELET
BASOS PCT: 0 %
Basophils Absolute: 0 10*3/uL (ref 0.0–0.1)
Eosinophils Absolute: 0.1 10*3/uL (ref 0.0–0.7)
Eosinophils Relative: 1 %
HEMATOCRIT: 44.6 % (ref 39.0–52.0)
Hemoglobin: 15.2 g/dL (ref 13.0–17.0)
LYMPHS ABS: 1.2 10*3/uL (ref 0.7–4.0)
Lymphocytes Relative: 10 %
MCH: 29.7 pg (ref 26.0–34.0)
MCHC: 34.1 g/dL (ref 30.0–36.0)
MCV: 87.3 fL (ref 78.0–100.0)
MONO ABS: 0.8 10*3/uL (ref 0.1–1.0)
Monocytes Relative: 7 %
NEUTROS ABS: 9.5 10*3/uL — AB (ref 1.7–7.7)
Neutrophils Relative %: 82 %
PLATELETS: 226 10*3/uL (ref 150–400)
RBC: 5.11 MIL/uL (ref 4.22–5.81)
RDW: 14.1 % (ref 11.5–15.5)
WBC: 11.6 10*3/uL — AB (ref 4.0–10.5)

## 2016-08-04 LAB — COMPREHENSIVE METABOLIC PANEL
ALT: 40 U/L (ref 17–63)
AST: 37 U/L (ref 15–41)
Albumin: 4 g/dL (ref 3.5–5.0)
Alkaline Phosphatase: 53 U/L (ref 38–126)
Anion gap: 10 (ref 5–15)
BUN: 10 mg/dL (ref 6–20)
CO2: 23 mmol/L (ref 22–32)
Calcium: 9.1 mg/dL (ref 8.9–10.3)
Chloride: 101 mmol/L (ref 101–111)
Creatinine, Ser: 1.19 mg/dL (ref 0.61–1.24)
GFR calc Af Amer: >60 mL/min (ref >60)
GFR calc non Af Amer: >60 mL/min (ref >60)
Glucose, Bld: 105 mg/dL — ABNORMAL HIGH (ref 65–99)
Potassium: 4.3 mmol/L (ref 3.5–5.1)
Sodium: 134 mmol/L — ABNORMAL LOW (ref 135–145)
Total Bilirubin: 0.6 mg/dL (ref 0.3–1.2)
Total Protein: 7.2 g/dL (ref 6.5–8.1)

## 2016-08-04 LAB — I-STAT CG4 LACTIC ACID, ED: Lactic Acid, Venous: 1.58 mmol/L (ref 0.5–1.9)

## 2016-08-04 MED ORDER — ACETAMINOPHEN 325 MG PO TABS
650.0000 mg | ORAL_TABLET | Freq: Once | ORAL | Status: AC
  Administered 2016-08-04: 650 mg via ORAL
  Filled 2016-08-04: qty 2

## 2016-08-04 NOTE — ED Triage Notes (Signed)
No fever reducer taken

## 2016-08-04 NOTE — ED Triage Notes (Signed)
The pt is c/o general body aches since this am headache nausea  Chills most of the day with sweating coughing headache several family members have the same symptoms

## 2016-08-05 ENCOUNTER — Emergency Department (HOSPITAL_COMMUNITY)
Admission: EM | Admit: 2016-08-05 | Discharge: 2016-08-05 | Disposition: A | Discharge status: Home / Self Care | Payer: Self-pay | Attending: Emergency Medicine | Admitting: Emergency Medicine

## 2016-08-05 DIAGNOSIS — J111 Influenza due to unidentified influenza virus with other respiratory manifestations: Secondary | ICD-10-CM

## 2016-08-05 MED ORDER — HYDROCODONE-HOMATROPINE 5-1.5 MG/5ML PO SYRP: 5.0000 mL | ORAL_SOLUTION | Freq: Four times a day (QID) | ORAL | 0 refills | Status: AC | PRN

## 2016-08-05 MED ORDER — ONDANSETRON 4 MG PO TBDP
8.0000 mg | ORAL_TABLET | Freq: Once | ORAL | Status: AC
  Administered 2016-08-05: 8 mg via ORAL
  Filled 2016-08-05: qty 2

## 2016-08-05 MED ORDER — PROMETHAZINE HCL 25 MG PO TABS: 25.0000 mg | ORAL_TABLET | Freq: Four times a day (QID) | ORAL | 0 refills | Status: AC | PRN

## 2016-08-05 MED ORDER — IBUPROFEN 800 MG PO TABS
800.0000 mg | ORAL_TABLET | Freq: Once | ORAL | Status: AC
  Administered 2016-08-05: 800 mg via ORAL
  Filled 2016-08-05: qty 1

## 2016-08-05 NOTE — ED Provider Notes (Signed)
[facility]-EMERGENCY DEPT Provider Note   CSN: [id number] Arrival date & time: 08/04/16  2012   By signing my name below, I, Clarisse Gouge, attest that this documentation has been prepared under the direction and in the presence of Gilda Crease, MD. Electronically signed, Clarisse Gouge, ED Scribe. 08/05/16. 1:48 AM.   History   Chief Complaint Chief Complaint  Patient presents with  . Generalized Body Aches   The history is provided by the patient and medical records. No language interpreter was used.    HPI Comments: Dennis Kantz. is a 20 y.o. male who presents to the Emergency Department complaining of gradually worsening generalized body aches x 2 days. Pt reports associated headache, cough, body aches, chills and diaphoresis. He notes multiple sick contacts at home with similar symptoms. Pt denies sore throat, vomiting and diarrhea.  Past Medical History:  Diagnosis Date  . ADHD (attention deficit hyperactivity disorder)   . Anxiety   . Bipolar 1 disorder (HCC)   . CHO intolerance    high cholesterol, bipolar, anxiety disorder, ADHD  . Headache(784.0)   . IBS (irritable bowel syndrome)   . Irritable bowel syndrome   . Lumbar cord injury without spinal bone injury [facility])     Patient Active Problem List   Diagnosis Date Noted  . Bipolar 1 disorder, depressed (HCC) 08/14/2013  . Major depressive disorder, recurrent episode, moderate (HCC) 11/09/2012    Past Surgical History:  Procedure Laterality Date  . BACK SURGERY    . HYDROCELE EXCISION / REPAIR  2005       Home Medications    Prior to Admission medications   Medication Sig Start Date End Date Taking? Authorizing Provider  acetaminophen (TYLENOL) 500 MG tablet Take 3,000 mg by mouth once.    Historical Provider, MD  amoxicillin-clavulanate (AUGMENTIN) 875-125 MG tablet Take 1 tablet by mouth every 12 (twelve) hours. 06/09/16   Shawn C Joy, PA-C  azithromycin (ZITHROMAX Z-PAK) 250 MG tablet Take 1  tablet (250 mg total) by mouth daily. 01/02/16   Burgess Amor, PA-C  bacitracin ointment Apply 1 application topically 2 (two) times daily. 02/25/14   Brandt Loosen, MD  benzonatate (TESSALON) 100 MG capsule Take 1 capsule (100 mg total) by mouth every 8 (eight) hours. 01/02/16   Burgess Amor, PA-C  benzonatate (TESSALON) 100 MG capsule Take 1 capsule (100 mg total) by mouth every 8 (eight) hours. 06/09/16   Shawn C Joy, PA-C  CAFFEINE PO Take 1 tablet by mouth once.    Historical Provider, MD  gabapentin (NEURONTIN) 600 MG tablet Take 600 mg by mouth daily as needed (for pain).    Historical Provider, MD  HYDROcodone-homatropine (HYCODAN) 5-1.5 MG/5ML syrup Take 5 mLs by mouth every 6 (six) hours as needed for cough. 08/05/16   Gilda Crease, MD  hydrOXYzine (ATARAX/VISTARIL) 25 MG tablet Take 1 tablet (25 mg total) by mouth every 6 (six) hours. 02/26/14   Brandt Loosen, MD  PB-Hyoscy-Atropine-Scopolamine (DONNATAL PO) Take 1 Bottle by mouth every 6 (six) hours as needed (for stoamch pain. Samples from MD.).    Historical Provider, MD  promethazine (PHENERGAN) 25 MG tablet Take 1 tablet (25 mg total) by mouth every 6 (six) hours as needed for nausea or vomiting. 08/05/16   Gilda Crease, MD  traMADol (ULTRAM) 50 MG tablet Take 1 tablet (50 mg total) by mouth every 6 (six) hours as needed. 03/10/14   Jaynie Crumble, PA-C    Family History No family history  on file.  Social History Social History  Substance Use Topics  . Smoking status: Current Every Day Smoker    Packs/day: 1.50    Years: 3.00    Types: Cigarettes  . Smokeless tobacco: Current User    Types: Snuff, Chew  . Alcohol use Yes     Allergies   Calamari oil; Other; Klonopin [clonazepam]; and Lamictal [lamotrigine]   Review of Systems Review of Systems  All other systems reviewed and are negative.  A complete 10 system review of systems was obtained and all systems are negative except as noted in the HPI and PMH.      Physical Exam Updated Vital Signs BP 122/59 (BP Location: Right Arm)   Pulse 114   Temp 101 F (38.3 C) (Oral)   Resp 18   Ht 6' (1.829 m)   Wt 253 lb 3 oz (114.8 kg)   SpO2 98%   BMI 34.34 kg/m   Physical Exam  Constitutional: He is oriented to person, place, and time. He appears well-developed and well-nourished. No distress.  HENT:  Head: Normocephalic and atraumatic.  Right Ear: Hearing normal.  Left Ear: Hearing normal.  Nose: Nose normal.  Mouth/Throat: Oropharynx is clear and moist and mucous membranes are normal.  Eyes: Conjunctivae and EOM are normal. Pupils are equal, round, and reactive to light.  Neck: Normal range of motion. Neck supple.  Cardiovascular: Regular rhythm, S1 normal and S2 normal.  Tachycardia present.  Exam reveals no gallop and no friction rub.   No murmur heard. Pulmonary/Chest: Effort normal and breath sounds normal. No respiratory distress. He exhibits no tenderness.  Abdominal: Soft. Normal appearance and bowel sounds are normal. There is no hepatosplenomegaly. There is no tenderness. There is no rebound, no guarding, no tenderness at McBurney's point and negative Murphy's sign. No hernia.  Musculoskeletal: Normal range of motion.  Neurological: He is alert and oriented to person, place, and time. He has normal strength. No cranial nerve deficit or sensory deficit. Coordination normal. GCS eye subscore is 4. GCS verbal subscore is 5. GCS motor subscore is 6.  Skin: Skin is warm, dry and intact. No rash noted. No cyanosis.  Psychiatric: He has a normal mood and affect. His speech is normal and behavior is normal. Thought content normal.  Nursing note and vitals reviewed.    ED Treatments / Results  DIAGNOSTIC STUDIES: Oxygen Saturation is 98% on RA, normal by my interpretation.    COORDINATION OF CARE: 1:48 AM Discussed treatment plan with pt at bedside and pt agreed to plan.  Labs (all labs ordered are listed, but only abnormal results  are displayed) Labs Reviewed  CBC WITH DIFFERENTIAL/PLATELET - Abnormal; Notable for the following:       Result Value   WBC 11.6 (*)    Neutro Abs 9.5 (*)    All other components within normal limits  COMPREHENSIVE METABOLIC PANEL - Abnormal; Notable for the following:    Sodium 134 (*)    Glucose, Bld 105 (*)    All other components within normal limits  I-STAT CG4 LACTIC ACID, ED    EKG  EKG Interpretation  Date/Time:  Tuesday August 04 2016 20:30:56 EST Ventricular Rate:  132 PR Interval:  136 QRS Duration: 76 QT Interval:  280 QTC Calculation: 414 R Axis:   52 Text Interpretation:  Sinus tachycardia Otherwise normal ECG Confirmed by Blinda LeatherwoodPOLLINA  MD, Brixon Zhen [phone]([id number]) on 08/05/2016 1:32:51 AM       Radiology Dg Chest 2 View  Result Date: 08/04/2016 CLINICAL DATA:  Generalized body aches. Central chest pain. Productive cough. Fever. EXAM: CHEST  2 VIEW COMPARISON:  01/01/2016 FINDINGS: The cardiomediastinal contours are normal. Resolved lingular and right middle lobe consolidations from prior exam. Pulmonary vasculature is normal. No new consolidation, pleural effusion, or pneumothorax. No acute osseous abnormalities are seen. IMPRESSION: No acute pulmonary process. Electronically Signed   By: Rubye OaksMelanie  Ehinger M.D.   On: 08/04/2016 21:18    Procedures Procedures (including critical care time)  Medications Ordered in ED Medications  ondansetron (ZOFRAN-ODT) disintegrating tablet 8 mg (not administered)  ibuprofen (ADVIL,MOTRIN) tablet 800 mg (not administered)  acetaminophen (TYLENOL) tablet 650 mg (650 mg Oral Given 08/04/16 2030)     Initial Impression / Assessment and Plan / ED Course  I have reviewed the triage vital signs and the nursing notes.  Pertinent labs & imaging results that were available during my care of the patient were reviewed by me and considered in my medical decision making (see chart for details).  Clinical Course     Patient with  symptoms consistent with influenza.  Vitals are stable, low-grade fever.  No signs of dehydration, tolerating PO's.  Lungs are clear. Chest x-ray was negative.  Patient will be discharged with instructions to orally hydrate, rest, and use over-the-counter medications such as anti-inflammatories ibuprofen and Aleve for muscle aches and Tylenol for fever.  Patient will also be given a cough suppressant.    Final Clinical Impressions(s) / ED Diagnoses   Final diagnoses:  Influenza    New Prescriptions New Prescriptions   HYDROCODONE-HOMATROPINE (HYCODAN) 5-1.5 MG/5ML SYRUP    Take 5 mLs by mouth every 6 (six) hours as needed for cough.   PROMETHAZINE (PHENERGAN) 25 MG TABLET    Take 1 tablet (25 mg total) by mouth every 6 (six) hours as needed for nausea or vomiting.  I personally performed the services described in this documentation, which was scribed in my presence. The recorded information has been reviewed and is accurate.    Gilda Creasehristopher J Aidaly Cordner, MD 08/05/16 [phone]

## 2017-05-27 ENCOUNTER — Encounter (HOSPITAL_COMMUNITY): Payer: Self-pay

## 2017-05-27 DIAGNOSIS — F17210 Nicotine dependence, cigarettes, uncomplicated: Secondary | ICD-10-CM | POA: Insufficient documentation

## 2017-05-27 DIAGNOSIS — Z79899 Other long term (current) drug therapy: Secondary | ICD-10-CM | POA: Insufficient documentation

## 2017-05-27 DIAGNOSIS — A084 Viral intestinal infection, unspecified: Secondary | ICD-10-CM | POA: Insufficient documentation

## 2017-05-27 LAB — COMPREHENSIVE METABOLIC PANEL
ALT: 19 U/L (ref 17–63)
AST: 26 U/L (ref 15–41)
Albumin: 4.2 g/dL (ref 3.5–5.0)
Alkaline Phosphatase: 60 U/L (ref 38–126)
Anion gap: 9 (ref 5–15)
BUN: 9 mg/dL (ref 6–20)
CHLORIDE: 98 mmol/L — AB (ref 101–111)
CO2: 26 mmol/L (ref 22–32)
CREATININE: 1.05 mg/dL (ref 0.61–1.24)
Calcium: 8.8 mg/dL — ABNORMAL LOW (ref 8.9–10.3)
Glucose, Bld: 95 mg/dL (ref 65–99)
Potassium: 3.7 mmol/L (ref 3.5–5.1)
Sodium: 133 mmol/L — ABNORMAL LOW (ref 135–145)
Total Bilirubin: 0.7 mg/dL (ref 0.3–1.2)
Total Protein: 7.2 g/dL (ref 6.5–8.1)

## 2017-05-27 LAB — URINALYSIS, ROUTINE W REFLEX MICROSCOPIC
Bilirubin Urine: NEGATIVE
Glucose, UA: NEGATIVE mg/dL
Hgb urine dipstick: NEGATIVE
Ketones, ur: NEGATIVE mg/dL
Leukocytes, UA: NEGATIVE
Nitrite: NEGATIVE
PROTEIN: NEGATIVE mg/dL
Specific Gravity, Urine: 1.018 (ref 1.005–1.030)
pH: 5 (ref 5.0–8.0)

## 2017-05-27 LAB — CBC
HCT: 45.3 % (ref 39.0–52.0)
Hemoglobin: 15.4 g/dL (ref 13.0–17.0)
MCH: 29.7 pg (ref 26.0–34.0)
MCHC: 34 g/dL (ref 30.0–36.0)
MCV: 87.3 fL (ref 78.0–100.0)
PLATELETS: 216 10*3/uL (ref 150–400)
RBC: 5.19 MIL/uL (ref 4.22–5.81)
RDW: 14.1 % (ref 11.5–15.5)
WBC: 9.3 10*3/uL (ref 4.0–10.5)

## 2017-05-27 LAB — LIPASE, BLOOD: LIPASE: 23 U/L (ref 11–51)

## 2017-05-27 NOTE — ED Triage Notes (Signed)
Pt reports body aches and nausea x several days with vomiting that began today. Denies diarrhea. Endorses abdominal pain x several weeks

## 2017-05-28 ENCOUNTER — Emergency Department (HOSPITAL_COMMUNITY)
Admission: EM | Admit: 2017-05-28 | Discharge: 2017-05-28 | Disposition: A | Discharge status: Home / Self Care | Payer: Self-pay | Attending: Emergency Medicine | Admitting: Emergency Medicine

## 2017-05-28 DIAGNOSIS — K2970 Gastritis, unspecified, without bleeding: Secondary | ICD-10-CM

## 2017-05-28 MED ORDER — KETOROLAC TROMETHAMINE 30 MG/ML IJ SOLN
30.0000 mg | Freq: Once | INTRAMUSCULAR | Status: AC
  Administered 2017-05-28: 30 mg via INTRAVENOUS
  Filled 2017-05-28: qty 1

## 2017-05-28 MED ORDER — ONDANSETRON HCL 4 MG PO TABS: 4.0000 mg | ORAL_TABLET | Freq: Four times a day (QID) | ORAL | 0 refills | Status: AC | PRN

## 2017-05-28 MED ORDER — SODIUM CHLORIDE 0.9 % IV BOLUS (SEPSIS)
1000.0000 mL | Freq: Once | INTRAVENOUS | Status: AC
  Administered 2017-05-28: 1000 mL via INTRAVENOUS

## 2017-05-28 MED ORDER — ONDANSETRON HCL 4 MG/2ML IJ SOLN
4.0000 mg | Freq: Once | INTRAMUSCULAR | Status: AC
  Administered 2017-05-28: 4 mg via INTRAVENOUS
  Filled 2017-05-28: qty 2

## 2017-05-28 NOTE — ED Notes (Signed)
Pt verbalizes understanding of d/c instructions. Pt received prescriptions. Pt ambulatory at d/c with all belongings.  

## 2017-05-28 NOTE — ED Provider Notes (Signed)
[facility] EMERGENCY DEPARTMENT Provider Note   CSN: [id number] Arrival date & time: 05/27/17  2030     History   Chief Complaint Chief Complaint  Patient presents with  . Abdominal Pain  . Emesis    HPI Dennis Fauth. is a 21 y.o. male.  The history is provided by the patient.  Abdominal Pain   Associated symptoms include vomiting.  Emesis   Associated symptoms include abdominal pain.  He comes in with a generalized body aches for the last 2 days.  He rates pain at 4/10.  Today, he developed nausea and vomiting.  He has vomited several times.  He denies diarrhea.  He denies fever chills or sweats.  Nothing makes symptoms better, nothing makes them worse.  He has had a sick contact at home.  Past Medical History:  Diagnosis Date  . ADHD (attention deficit hyperactivity disorder)   . Anxiety   . Bipolar 1 disorder (HCC)   . CHO intolerance    high cholesterol, bipolar, anxiety disorder, ADHD  . Headache(784.0)   . IBS (irritable bowel syndrome)   . Irritable bowel syndrome   . Lumbar cord injury without spinal bone injury [facility])     Patient Active Problem List   Diagnosis Date Noted  . Bipolar 1 disorder, depressed (HCC) 08/14/2013  . Major depressive disorder, recurrent episode, moderate (HCC) 11/09/2012    Past Surgical History:  Procedure Laterality Date  . BACK SURGERY    . HYDROCELE EXCISION / REPAIR  2005       Home Medications    Prior to Admission medications   Medication Sig Start Date End Date Taking? Authorizing Provider  acetaminophen (TYLENOL) 500 MG tablet Take 3,000 mg by mouth once.    [provider]  amoxicillin-clavulanate (AUGMENTIN) 875-125 MG tablet Take 1 tablet by mouth every 12 (twelve) hours. 06/09/16   Joy, Shawn C, PA-C  azithromycin (ZITHROMAX Z-PAK) 250 MG tablet Take 1 tablet (250 mg total) by mouth daily. 01/02/16   Burgess Amor, PA-C  bacitracin ointment Apply 1 application topically 2 (two)  times daily. 02/25/14   Brandt Loosen, MD  benzonatate (TESSALON) 100 MG capsule Take 1 capsule (100 mg total) by mouth every 8 (eight) hours. 01/02/16   Burgess Amor, PA-C  benzonatate (TESSALON) 100 MG capsule Take 1 capsule (100 mg total) by mouth every 8 (eight) hours. 06/09/16   Joy, Shawn C, PA-C  CAFFEINE PO Take 1 tablet by mouth once.    [provider]  gabapentin (NEURONTIN) 600 MG tablet Take 600 mg by mouth daily as needed (for pain).    [provider]  HYDROcodone-homatropine (HYCODAN) 5-1.5 MG/5ML syrup Take 5 mLs by mouth every 6 (six) hours as needed for cough. 08/05/16   Gilda Crease, MD  hydrOXYzine (ATARAX/VISTARIL) 25 MG tablet Take 1 tablet (25 mg total) by mouth every 6 (six) hours. 02/26/14   Brandt Loosen, MD  PB-Hyoscy-Atropine-Scopolamine (DONNATAL PO) Take 1 Bottle by mouth every 6 (six) hours as needed (for stoamch pain. Samples from MD.).    [provider]  promethazine (PHENERGAN) 25 MG tablet Take 1 tablet (25 mg total) by mouth every 6 (six) hours as needed for nausea or vomiting. 08/05/16   Gilda Crease, MD  traMADol (ULTRAM) 50 MG tablet Take 1 tablet (50 mg total) by mouth every 6 (six) hours as needed. 03/10/14   Jaynie Crumble, PA-C    Family History No family history on file.  Social History Social History  Substance Use Topics  . Smoking status: Current Every Day Smoker    Packs/day: 1.50    Years: 3.00    Types: Cigarettes  . Smokeless tobacco: Current User    Types: Snuff, Chew  . Alcohol use Yes     Allergies   Calamari oil; Other; Klonopin [clonazepam]; and Lamictal [lamotrigine]   Review of Systems Review of Systems  Gastrointestinal: Positive for abdominal pain and vomiting.  All other systems reviewed and are negative.    Physical Exam Updated Vital Signs BP (!) 118/54 (BP Location: Right Arm)   Pulse 78   Temp 100 F (37.8 C) (Oral)   Resp 16   SpO2 98%   Physical Exam    Nursing note and vitals reviewed.  21 year old male, resting comfortably and in no acute distress. Vital signs are normal. Oxygen saturation is 98%, which is normal. Head is normocephalic and atraumatic. PERRLA, EOMI. Oropharynx is clear. Neck is nontender and supple without adenopathy or JVD. Back is nontender and there is no CVA tenderness. Lungs are clear without rales, wheezes, or rhonchi. Chest is nontender. Heart has regular rate and rhythm without murmur. Abdomen is soft, flat, nontender without masses or hepatosplenomegaly and peristalsis is hypoactive. Extremities have no cyanosis or edema, full range of motion is present. Skin is warm and dry without rash. Neurologic: Mental status is normal, cranial nerves are intact, there are no motor or sensory deficits.  ED Treatments / Results  Labs (all labs ordered are listed, but only abnormal results are displayed) Labs Reviewed  COMPREHENSIVE METABOLIC PANEL - Abnormal; Notable for the following:       Result Value   Sodium 133 (*)    Chloride 98 (*)    Calcium 8.8 (*)    All other components within normal limits  LIPASE, BLOOD  CBC  URINALYSIS, ROUTINE W REFLEX MICROSCOPIC   Procedures Procedures (including critical care time)  Medications Ordered in ED Medications  sodium chloride 0.9 % bolus 1,000 mL (0 mLs Intravenous Stopped 05/28/17 0445)  ondansetron (ZOFRAN) injection 4 mg (4 mg Intravenous Given 05/28/17 0412)  ketorolac (TORADOL) 30 MG/ML injection 30 mg (30 mg Intravenous Given 05/28/17 0412)     Initial Impression / Assessment and Plan / ED Course  I have reviewed the triage vital signs and the nursing notes.  Pertinent lab results that were available during my care of the patient were reviewed by me and considered in my medical decision making (see chart for details).  Nausea and vomiting with myalgias.  Clinically, this appears to be a viral gastritis.  No red flags to suggest more serious illness.   Laboratory workup is reassuring.  He will be given IV fluids, ondansetron, ketorolac.  Old records are reviewed, and he has no relevant past visits.  He feels much better after above noted treatment.  He is discharged with prescription for ondansetron, advised to use over-the-counter acetaminophen as needed for fever or aching.  Return precautions discussed.  Given work release to be off for the next 48 hours.  Final Clinical Impressions(s) / ED Diagnoses   Final diagnoses:  Viral gastritis    New Prescriptions New Prescriptions   ONDANSETRON (ZOFRAN) 4 MG TABLET    Take 1 tablet (4 mg total) by mouth every 6 (six) hours as needed for nausea or vomiting.     Dione BoozeGlick, Raeden Belzer, MD 05/28/17 [phone]

## 2017-05-28 NOTE — Discharge Instructions (Signed)
Drink plenty of fluids. Take acetaminophen for fever or aching. Return if symptoms are getting worse.
